# Patient Record
Sex: Male | Born: 1985 | Race: White | Hispanic: No | Marital: Single | Smoking: Current every day smoker
Health system: Southern US, Community
[De-identification: ages and names within clinical notes are randomized; demographics above are authoritative.]

## PROBLEM LIST (undated history)

## (undated) DIAGNOSIS — N289 Disorder of kidney and ureter, unspecified: Secondary | ICD-10-CM

## (undated) DIAGNOSIS — N200 Calculus of kidney: Secondary | ICD-10-CM

## (undated) HISTORY — PX: CHOLECYSTECTOMY: SHX55

---

## 2015-11-15 ENCOUNTER — Emergency Department (HOSPITAL_COMMUNITY): Payer: Medicare Other

## 2015-11-15 ENCOUNTER — Encounter (HOSPITAL_COMMUNITY): Payer: Self-pay | Admitting: *Deleted

## 2015-11-15 ENCOUNTER — Emergency Department (HOSPITAL_COMMUNITY)
Admission: EM | Admit: 2015-11-15 | Discharge: 2015-11-15 | Disposition: A | Discharge status: Home / Self Care | Payer: Medicare Other | Attending: Emergency Medicine | Admitting: Emergency Medicine

## 2015-11-15 DIAGNOSIS — F17200 Nicotine dependence, unspecified, uncomplicated: Secondary | ICD-10-CM | POA: Insufficient documentation

## 2015-11-15 DIAGNOSIS — S91332A Puncture wound without foreign body, left foot, initial encounter: Secondary | ICD-10-CM | POA: Diagnosis not present

## 2015-11-15 DIAGNOSIS — J069 Acute upper respiratory infection, unspecified: Secondary | ICD-10-CM | POA: Diagnosis not present

## 2015-11-15 DIAGNOSIS — Y998 Other external cause status: Secondary | ICD-10-CM | POA: Insufficient documentation

## 2015-11-15 DIAGNOSIS — Y92007 Garden or yard of unspecified non-institutional (private) residence as the place of occurrence of the external cause: Secondary | ICD-10-CM | POA: Insufficient documentation

## 2015-11-15 DIAGNOSIS — B9789 Other viral agents as the cause of diseases classified elsewhere: Secondary | ICD-10-CM

## 2015-11-15 DIAGNOSIS — W268XXA Contact with other sharp object(s), not elsewhere classified, initial encounter: Secondary | ICD-10-CM | POA: Insufficient documentation

## 2015-11-15 DIAGNOSIS — Y9301 Activity, walking, marching and hiking: Secondary | ICD-10-CM | POA: Diagnosis not present

## 2015-11-15 MED ORDER — CIPROFLOXACIN HCL 500 MG PO TABS
500.0000 mg | ORAL_TABLET | Freq: Once | ORAL | Status: AC
  Administered 2015-11-15: 500 mg via ORAL
  Filled 2015-11-15: qty 1

## 2015-11-15 MED ORDER — PREDNISONE 20 MG PO TABS: 40.0000 mg | ORAL_TABLET | Freq: Every day | ORAL | Status: DC

## 2015-11-15 MED ORDER — OXYCODONE-ACETAMINOPHEN 5-325 MG PO TABS
2.0000 | ORAL_TABLET | Freq: Once | ORAL | Status: AC
Start: 2015-11-15 — End: 2015-11-15
  Administered 2015-11-15: 2 via ORAL
  Filled 2015-11-15: qty 2

## 2015-11-15 MED ORDER — OXYCODONE-ACETAMINOPHEN 5-325 MG PO TABS: 1.0000 | ORAL_TABLET | ORAL | Status: DC | PRN

## 2015-11-15 MED ORDER — FLUTICASONE PROPIONATE 50 MCG/ACT NA SUSP: 2.0000 | Freq: Every day | NASAL | Status: DC

## 2015-11-15 MED ORDER — CIPROFLOXACIN HCL 500 MG PO TABS: 500.0000 mg | ORAL_TABLET | Freq: Two times a day (BID) | ORAL | Status: DC

## 2015-11-15 MED ORDER — ALBUTEROL SULFATE HFA 108 (90 BASE) MCG/ACT IN AERS
2.0000 | INHALATION_SPRAY | Freq: Once | RESPIRATORY_TRACT | Status: AC
  Administered 2015-11-15: 2 via RESPIRATORY_TRACT
  Filled 2015-11-15: qty 6.7

## 2015-11-15 MED ORDER — BENZONATATE 100 MG PO CAPS: 100.0000 mg | ORAL_CAPSULE | Freq: Three times a day (TID) | ORAL | Status: DC | PRN

## 2015-11-15 NOTE — ED Provider Notes (Signed)
History  By signing my name below, I, Karle Plumber, attest that this documentation has been prepared under the direction and in the presence of [employer], PA-C. Electronically Signed: Karle Plumber, ED Scribe. 11/15/2015. 11:09 PM.  Chief Complaint  Patient presents with  . Puncture Wound  . Nasal Congestion   The history is provided by the patient and medical records. No language interpreter was used.    HPI Comments:  Jordan Matthews is a 29 y.o. male who presents to the Emergency Department complaining of nasal and chest congestion that began 2-3 days ago. He reports associated productive cough of yellowish mucus. He also reports chills and diaphoresis. He has been taking DayQuil and NyQuil with no significant relief of the symptoms. Lying down increases the cough. He denies modifying factors. He denies fever, nausea or vomiting. Pt is a smoker. He also reports a puncture wound to the plantar aspect of the left foot that he sustained about 11 hours ago. He states he was walking around in his yard when he stepped on something metal that penetrated his shoe and entered the skin. He has removed the metal has taken Ibuprofen 400 mg for pain. Walking increases the pain. Avoiding bearing weight helps alleviate the pain. He denies numbness, tingling or weakness of the left foot.   History reviewed. No pertinent past medical history. History reviewed. No pertinent past surgical history. No family history on file. Social History  Substance Use Topics  . Smoking status: Current Every Day Smoker  . Smokeless tobacco: None  . Alcohol Use: Yes    Review of Systems  HENT: Positive for congestion.   Skin: Positive for wound.  All other systems reviewed and are negative.   Allergies  Review of patient's allergies indicates not on file.  Home Medications   Prior to Admission medications   Medication Sig Start Date End Date Taking? Authorizing Provider  benzonatate (TESSALON) 100 MG  capsule Take 1 capsule (100 mg total) by mouth 3 (three) times daily as needed for cough. 11/15/15   Antony Madura, PA-C  ciprofloxacin (CIPRO) 500 MG tablet Take 1 tablet (500 mg total) by mouth 2 (two) times daily. 11/15/15   Antony Madura, PA-C  fluticasone (FLONASE) 50 MCG/ACT nasal spray Place 2 sprays into both nostrils daily. 11/15/15   Antony Madura, PA-C  oxyCODONE-acetaminophen (PERCOCET/ROXICET) 5-325 MG tablet Take 1 tablet by mouth every 4 (four) hours as needed for severe pain. 11/15/15   Antony Madura, PA-C  predniSONE (DELTASONE) 20 MG tablet Take 2 tablets (40 mg total) by mouth daily. 11/15/15   Antony Madura, PA-C   Triage Vitals: BP 153/91 mmHg  Pulse 69  Temp(Src) 98.1 F (36.7 C) (Oral)  Resp 18  SpO2 98%  Physical Exam  Constitutional: He is oriented to person, place, and time. He appears well-developed and well-nourished. No distress.  Nontoxic/nonseptic appearing  HENT:  Head: Normocephalic and atraumatic.  Right Ear: External ear normal.  Left Ear: External ear normal.  Nose: Mucosal edema (mild) present. No rhinorrhea.  Mouth/Throat: Uvula is midline, oropharynx is clear and moist and mucous membranes are normal.  Eyes: Conjunctivae and EOM are normal. No scleral icterus.  Neck: Normal range of motion.  Cardiovascular: Normal rate, regular rhythm and intact distal pulses.   Pulmonary/Chest: Effort normal and breath sounds normal. No respiratory distress. He has no wheezes. He has no rales.  Lungs CTAB. Respirations even and unlabored.  Musculoskeletal: Normal range of motion.  Neurological: He is alert and oriented to  person, place, and time. He exhibits normal muscle tone. Coordination normal.  Patient ambulatory with steady gait. Sensation to light touch intact in LLE.  Skin: Skin is warm and dry. No rash noted. He is not diaphoretic. No erythema. No pallor.  Puncture wound to ball of L foot. No erythema, swelling, or heat to touch. Mild TTP. No FB or deformity.   Psychiatric: He has a normal mood and affect. His behavior is normal.  Nursing note and vitals reviewed.   ED Course  Procedures (including critical care time) DIAGNOSTIC STUDIES: Oxygen Saturation is 98% on RA, normal by my interpretation.   COORDINATION OF CARE: 11:08 PM- Will prescribe antibiotic, pain medication for left foot. Will prescribe nasal spray, MDI, cough medication and steroids for congestion. Pt verbalizes understanding and agrees to plan.  Medications  ciprofloxacin (CIPRO) tablet 500 mg (not administered)  oxyCODONE-acetaminophen (PERCOCET/ROXICET) 5-325 MG per tablet 2 tablet (not administered)  albuterol (PROVENTIL HFA;VENTOLIN HFA) 108 (90 BASE) MCG/ACT inhaler 2 puff (not administered)    Labs Review Labs Reviewed - No data to display  Imaging Review Dg Foot Complete Left  11/15/2015  CLINICAL DATA:  Left foot pain on the plantar surface at the metatarsal area of the great toe. Patient stepped on nail today. Hurts to walk on the left foot. EXAM: LEFT FOOT - COMPLETE 3+ VIEW COMPARISON:  None. FINDINGS: There is no evidence of fracture or dislocation. There is no evidence of arthropathy or other focal bone abnormality. Soft tissues are unremarkable. No radiopaque soft tissue foreign bodies or gas collections identified. IMPRESSION: Negative. Electronically Signed   By: Burman NievesWilliam  Stevens M.D.   On: 11/15/2015 23:12   I have personally reviewed and evaluated these images and lab results as part of my medical decision-making.   EKG Interpretation None      MDM   Final diagnoses:  Puncture wound of foot, left, initial encounter  Viral URI with cough    Patient complaining of symptoms c/w URI; mild to moderate symptoms of clear/yellow nasal discharge/congestion and scratchy throat with cough for less than 10 days. Positive sick contacts in the home with similar symptoms. Patient is afebrile. No concern for acute bacterial rhinosinusitis; likely viral in  nature. Patient discharged with symptomatic treatment. Patient instructions given for warm saline nasal washes. Recommendations given for follow-up with primary care physician.    Patient also complaining of a puncture wound which happened this morning. X-ray negative and patient is neurovascularly intact. No evidence of secondary infection or cellulitis. Will start on ciprofloxacin for antibiotic coverage given that puncture occurred through shoe. Short course of percocet given for pain. Patient discharged in good condition with no unaddressed concerns.  I personally performed the services described in this documentation, which was scribed in my presence. The recorded information has been reviewed and is accurate.    Filed Vitals:   11/15/15 2208  BP: 153/91  Pulse: 69  Temp: 98.1 F (36.7 C)  TempSrc: Oral  Resp: 18  SpO2: 98%      Antony MaduraKelly Nuala Chiles, PA-C 11/15/15 2320  Alvira MondayErin Schlossman, MD 11/21/15 1338

## 2015-11-15 NOTE — ED Notes (Signed)
Pt states he stepped on a piece of metal that went through his shoe, causing a puncture wound to to the ball of his foot. Pt states he took 400mg  ibuprofen, which he states did not help with pain. Pt also complains of nasal congestion, sore throat and cough for the past 2-3 days.

## 2015-11-15 NOTE — Discharge Instructions (Signed)
For your puncture wound, take ciprofloxacin as prescribed to prevent infection. You may take Percocet for pain. Be sure to keep the wound clean and dry with warm soap and water.  For your upper respiratory infection take Tessalon as needed for cough and prednisone as prescribed for chest congestion. You may use an albuterol inhaler, 2 puffs every 4-6 hours, as needed for cough and shortness of breath. Take Tessalon as prescribed for cough as well. You may take ibuprofen for body aches. Follow-up with your primary care doctor for a recheck of symptoms.  Upper Respiratory Infection, Adult Most upper respiratory infections (URIs) are a viral infection of the air passages leading to the lungs. A URI affects the nose, throat, and upper air passages. The most common type of URI is nasopharyngitis and is typically referred to as "the common cold." URIs run their course and usually go away on their own. Most of the time, a URI does not require medical attention, but sometimes a bacterial infection in the upper airways can follow a viral infection. This is called a secondary infection. Sinus and middle ear infections are common types of secondary upper respiratory infections. Bacterial pneumonia can also complicate a URI. A URI can worsen asthma and chronic obstructive pulmonary disease (COPD). Sometimes, these complications can require emergency medical care and may be life threatening.  CAUSES Almost all URIs are caused by viruses. A virus is a type of germ and can spread from one person to another.  RISKS FACTORS You may be at risk for a URI if:   You smoke.   You have chronic heart or lung disease.  You have a weakened defense (immune) system.   You are very young or very old.   You have nasal allergies or asthma.  You work in crowded or poorly ventilated areas.  You work in health care facilities or schools. SIGNS AND SYMPTOMS  Symptoms typically develop 2-3 days after you come in contact  with a cold virus. Most viral URIs last 7-10 days. However, viral URIs from the influenza virus (flu virus) can last 14-18 days and are typically more severe. Symptoms may include:   Runny or stuffy (congested) nose.   Sneezing.   Cough.   Sore throat.   Headache.   Fatigue.   Fever.   Loss of appetite.   Pain in your forehead, behind your eyes, and over your cheekbones (sinus pain).  Muscle aches.  DIAGNOSIS  Your health care provider may diagnose a URI by:  Physical exam.  Tests to check that your symptoms are not due to another condition such as:  Strep throat.  Sinusitis.  Pneumonia.  Asthma. TREATMENT  A URI goes away on its own with time. It cannot be cured with medicines, but medicines may be prescribed or recommended to relieve symptoms. Medicines may help:  Reduce your fever.  Reduce your cough.  Relieve nasal congestion. HOME CARE INSTRUCTIONS   Take medicines only as directed by your health care provider.   Gargle warm saltwater or take cough drops to comfort your throat as directed by your health care provider.  Use a warm mist humidifier or inhale steam from a shower to increase air moisture. This may make it easier to breathe.  Drink enough fluid to keep your urine clear or pale yellow.   Eat soups and other clear broths and maintain good nutrition.   Rest as needed.   Return to work when your temperature has returned to normal or as your  health care provider advises. You may need to stay home longer to avoid infecting others. You can also use a face mask and careful hand washing to prevent spread of the virus.  Increase the usage of your inhaler if you have asthma.   Do not use any tobacco products, including cigarettes, chewing tobacco, or electronic cigarettes. If you need help quitting, ask your health care provider. PREVENTION  The best way to protect yourself from getting a cold is to practice good hygiene.   Avoid  oral or hand contact with people with cold symptoms.   Wash your hands often if contact occurs.  There is no clear evidence that vitamin C, vitamin E, echinacea, or exercise reduces the chance of developing a cold. However, it is always recommended to get plenty of rest, exercise, and practice good nutrition.  SEEK MEDICAL CARE IF:   You are getting worse rather than better.   Your symptoms are not controlled by medicine.   You have chills.  You have worsening shortness of breath.  You have brown or red mucus.  You have yellow or brown nasal discharge.  You have pain in your face, especially when you bend forward.  You have a fever.  You have swollen neck glands.  You have pain while swallowing.  You have white areas in the back of your throat. SEEK IMMEDIATE MEDICAL CARE IF:   You have severe or persistent:  Headache.  Ear pain.  Sinus pain.  Chest pain.  You have chronic lung disease and any of the following:  Wheezing.  Prolonged cough.  Coughing up blood.  A change in your usual mucus.  You have a stiff neck.  You have changes in your:  Vision.  Hearing.  Thinking.  Mood. MAKE SURE YOU:   Understand these instructions.  Will watch your condition.  Will get help right away if you are not doing well or get worse.   This information is not intended to replace advice given to you by your health care provider. Make sure you discuss any questions you have with your health care provider.   Document Released: 05/12/2001 Document Revised: 04/02/2015 Document Reviewed: 02/21/2014 Elsevier Interactive Patient Education 2016 [employer] Puncture Wound A puncture wound is an injury that is caused by a sharp, thin object that goes through (penetrates) your skin. Usually, a puncture wound does not leave a large opening in your skin, so it may not bleed a lot. However, when you get a puncture wound, dirt or other materials (foreign bodies) can be  forced into your wound and break off inside. This increases the chance of infection, such as tetanus. CAUSES Puncture wounds are caused by any sharp, thin object that goes through your skin, such as:  Animal teeth, as with an animal bite.  Sharp, pointed objects, such as nails, splinters of glass, fishhooks, and needles. SYMPTOMS Symptoms of a puncture wound include:  Pain.  Bleeding.  Swelling.  Bruising.  Fluid leaking from the wound.  Numbness, tingling, or loss of function. DIAGNOSIS This condition is diagnosed with a medical history and physical exam. Your wound will be checked to see if it contains any foreign bodies. You may also have X-rays or other imaging tests. TREATMENT Treatment for a puncture wound depends on how serious the wound is. It also depends on whether the wound contains any foreign bodies. Treatment for all types of puncture wounds usually starts with:  Controlling the bleeding.  Washing out the wound with a  germ-free (sterile) salt-water solution.  Checking the wound for foreign bodies. Treatment may also include:  Having the wound opened surgically to remove a foreign object.  Closing the wound with stitches (sutures) if it continues to bleed.  Covering the wound with antibiotic ointments and a bandage (dressing).  Receiving a tetanus shot.  Receiving a rabies vaccine. HOME CARE INSTRUCTIONS Medicines  Take or apply over-the-counter and prescription medicines only as told by your health care provider.  If you were prescribed an antibiotic, take or apply it as told by your health care provider. Do not stop using the antibiotic even if your condition improves. Wound Care  There are many ways to close and cover a wound. For example, a wound can be covered with sutures, skin glue, or adhesive strips. Follow instructions from your health care provider about:  How to take care of your wound.  When and how you should change your  dressing.  When you should remove your dressing.  Removing whatever was used to close your wound.  Keep the dressing dry as told by your health care provider. Do not take baths, swim, use a hot tub, or do anything that would put your wound underwater until your health care provider approves.  Clean the wound as told by your health care provider.  Do not scratch or pick at the wound.  Check your wound every day for signs of infection. Watch for:  Redness, swelling, or pain.  Fluid, blood, or pus. General Instructions  Raise (elevate) the injured area above the level of your heart while you are sitting or lying down.  If your puncture wound is in your foot, ask your health care provider if you need to avoid putting weight on your foot and for how long.  Keep all follow-up visits as told by your health care provider. This is important. SEEK MEDICAL CARE IF:  You received a tetanus shot and you have swelling, severe pain, redness, or bleeding at the injection site.  You have a fever.  Your sutures come out.  You notice a bad smell coming from your wound or your dressing.  You notice something coming out of your wound, such as wood or glass.  Your pain is not controlled with medicine.  You have increased redness, swelling, or pain at the site of your wound.  You have fluid, blood, or pus coming from your wound.  You notice a change in the color of your skin near your wound.  You need to change the dressing frequently due to fluid, blood, or pus draining from your wound.  You develop a new rash.  You develop numbness around your wound. SEEK IMMEDIATE MEDICAL CARE IF:  You develop severe swelling around your wound.  Your pain suddenly increases and is severe.  You develop painful skin lumps.  You have a red streak going away from your wound.  The wound is on your hand or foot and you cannot properly move a finger or toe.  The wound is on your hand or foot and you  notice that your fingers or toes look pale or bluish.   This information is not intended to replace advice given to you by your health care provider. Make sure you discuss any questions you have with your health care provider.   Document Released: 08/26/2005 Document Revised: 08/07/2015 Document Reviewed: 01/09/2015 Elsevier Interactive Patient Education [employer]

## 2015-12-05 ENCOUNTER — Emergency Department (HOSPITAL_COMMUNITY)
Admission: EM | Admit: 2015-12-05 | Discharge: 2015-12-05 | Disposition: A | Discharge status: Home / Self Care | Payer: Medicare Other | Attending: Emergency Medicine | Admitting: Emergency Medicine

## 2015-12-05 ENCOUNTER — Encounter (HOSPITAL_COMMUNITY): Payer: Self-pay | Admitting: Emergency Medicine

## 2015-12-05 ENCOUNTER — Emergency Department (HOSPITAL_COMMUNITY): Payer: Medicare Other

## 2015-12-05 DIAGNOSIS — R109 Unspecified abdominal pain: Secondary | ICD-10-CM | POA: Insufficient documentation

## 2015-12-05 DIAGNOSIS — R630 Anorexia: Secondary | ICD-10-CM | POA: Insufficient documentation

## 2015-12-05 DIAGNOSIS — Z7951 Long term (current) use of inhaled steroids: Secondary | ICD-10-CM | POA: Insufficient documentation

## 2015-12-05 DIAGNOSIS — R112 Nausea with vomiting, unspecified: Secondary | ICD-10-CM | POA: Diagnosis not present

## 2015-12-05 DIAGNOSIS — Z87442 Personal history of urinary calculi: Secondary | ICD-10-CM | POA: Diagnosis not present

## 2015-12-05 DIAGNOSIS — F17200 Nicotine dependence, unspecified, uncomplicated: Secondary | ICD-10-CM | POA: Insufficient documentation

## 2015-12-05 DIAGNOSIS — Z87448 Personal history of other diseases of urinary system: Secondary | ICD-10-CM | POA: Diagnosis not present

## 2015-12-05 HISTORY — DX: Disorder of kidney and ureter, unspecified: N28.9

## 2015-12-05 HISTORY — DX: Calculus of kidney: N20.0

## 2015-12-05 LAB — URINALYSIS, ROUTINE W REFLEX MICROSCOPIC
Bilirubin Urine: NEGATIVE
Glucose, UA: NEGATIVE mg/dL
HGB URINE DIPSTICK: NEGATIVE
Ketones, ur: NEGATIVE mg/dL
LEUKOCYTES UA: NEGATIVE
NITRITE: NEGATIVE
PROTEIN: NEGATIVE mg/dL
Specific Gravity, Urine: 1.014 (ref 1.005–1.030)
pH: 7 (ref 5.0–8.0)

## 2015-12-05 LAB — BASIC METABOLIC PANEL
Anion gap: 8 (ref 5–15)
BUN: 19 mg/dL (ref 6–20)
CALCIUM: 9.9 mg/dL (ref 8.9–10.3)
CO2: 29 mmol/L (ref 22–32)
Chloride: 104 mmol/L (ref 101–111)
Creatinine, Ser: 0.98 mg/dL (ref 0.61–1.24)
Glucose, Bld: 100 mg/dL — ABNORMAL HIGH (ref 65–99)
POTASSIUM: 4.5 mmol/L (ref 3.5–5.1)
SODIUM: 141 mmol/L (ref 135–145)

## 2015-12-05 LAB — CBC
HEMATOCRIT: 43.8 % (ref 39.0–52.0)
Hemoglobin: 14.8 g/dL (ref 13.0–17.0)
MCH: 32.2 pg (ref 26.0–34.0)
MCHC: 33.8 g/dL (ref 30.0–36.0)
MCV: 95.2 fL (ref 78.0–100.0)
Platelets: 185 10*3/uL (ref 150–400)
RBC: 4.6 MIL/uL (ref 4.22–5.81)
RDW: 12.2 % (ref 11.5–15.5)
WBC: 6.3 10*3/uL (ref 4.0–10.5)

## 2015-12-05 MED ORDER — SODIUM CHLORIDE 0.9 % IV BOLUS (SEPSIS)
1000.0000 mL | Freq: Once | INTRAVENOUS | Status: AC
  Administered 2015-12-05: 1000 mL via INTRAVENOUS

## 2015-12-05 MED ORDER — ONDANSETRON 8 MG PO TBDP: 8.0000 mg | ORAL_TABLET | Freq: Three times a day (TID) | ORAL | Status: DC | PRN

## 2015-12-05 MED ORDER — HYDROMORPHONE HCL 1 MG/ML IJ SOLN
1.0000 mg | Freq: Once | INTRAMUSCULAR | Status: AC
  Administered 2015-12-05: 1 mg via INTRAVENOUS
  Filled 2015-12-05: qty 1

## 2015-12-05 MED ORDER — KETOROLAC TROMETHAMINE 30 MG/ML IJ SOLN
30.0000 mg | Freq: Once | INTRAMUSCULAR | Status: AC
  Administered 2015-12-05: 30 mg via INTRAVENOUS
  Filled 2015-12-05: qty 1

## 2015-12-05 MED ORDER — ONDANSETRON HCL 4 MG/2ML IJ SOLN
4.0000 mg | Freq: Once | INTRAMUSCULAR | Status: AC
  Administered 2015-12-05: 4 mg via INTRAVENOUS
  Filled 2015-12-05: qty 2

## 2015-12-05 MED ORDER — IBUPROFEN 600 MG PO TABS: 600.0000 mg | ORAL_TABLET | Freq: Three times a day (TID) | ORAL | Status: AC | PRN

## 2015-12-05 NOTE — ED Provider Notes (Signed)
CSN: [id number]     Arrival date & time 12/05/15  1145 History   First MD Initiated Contact with Patient 12/05/15 1419     Chief Complaint  Patient presents with  . Flank Pain      HPI Patient presents to the emergency department complaining of left flank pain over the past 24-48 hours.  Reports nausea vomiting with poor appetite.  Denies fevers and chills.  No dysuria or urinary frequency.  He does have a history of kidney stones and states this feels similar to his prior discomfort and pain.  He's not allergic to medications.  He denies chest pain shortness breath.  His pain is moderate in severity this time   Past Medical History  Diagnosis Date  . Renal disorder   . Kidney stone    History reviewed. No pertinent past surgical history. No family history on file. Social History  Substance Use Topics  . Smoking status: Current Every Day Smoker  . Smokeless tobacco: None  . Alcohol Use: Yes    Review of Systems  All other systems reviewed and are negative.     Allergies  Review of patient's allergies indicates no known allergies.  Home Medications   Prior to Admission medications   Medication Sig Start Date End Date Taking? Authorizing Provider  ibuprofen (ADVIL,MOTRIN) 200 MG tablet Take 400-800 mg by mouth every 6 (six) hours as needed for moderate pain.   Yes Historical Provider, MD  benzonatate (TESSALON) 100 MG capsule Take 1 capsule (100 mg total) by mouth 3 (three) times daily as needed for cough. Patient not taking: Reported on 12/05/2015 11/15/15   Antony MaduraKelly Humes, PA-C  ciprofloxacin (CIPRO) 500 MG tablet Take 1 tablet (500 mg total) by mouth 2 (two) times daily. Patient not taking: Reported on 12/05/2015 11/15/15   Antony MaduraKelly Humes, PA-C  fluticasone [employer](FLONASE) 50 MCG/ACT nasal spray Place 2 sprays into both nostrils daily. Patient not taking: Reported on 12/05/2015 11/15/15   Antony MaduraKelly Humes, PA-C  oxyCODONE-acetaminophen (PERCOCET/ROXICET) 5-325 MG tablet Take 1 tablet by mouth  every 4 (four) hours as needed for severe pain. Patient not taking: Reported on 12/05/2015 11/15/15   Antony MaduraKelly Humes, PA-C  predniSONE (DELTASONE) 20 MG tablet Take 2 tablets (40 mg total) by mouth daily. Patient not taking: Reported on 12/05/2015 11/15/15   Antony MaduraKelly Humes, PA-C   BP 141/78 mmHg  Pulse 62  Temp(Src) 98.1 F (36.7 C) (Oral)  Resp 18  SpO2 100% Physical Exam  Constitutional: He is oriented to person, place, and time. He appears well-developed and well-nourished.  HENT:  Head: Normocephalic and atraumatic.  Eyes: EOM are normal.  Neck: Normal range of motion.  Cardiovascular: Normal rate, regular rhythm, normal heart sounds and intact distal pulses.   Pulmonary/Chest: Effort normal and breath sounds normal. No respiratory distress.  Abdominal: Soft. He exhibits no distension. There is no tenderness.  Musculoskeletal: Normal range of motion.  Neurological: He is alert and oriented to person, place, and time.  Skin: Skin is warm and dry.  Psychiatric: He has a normal mood and affect. Judgment normal.  Nursing note and vitals reviewed.   ED Course  Procedures (including critical care time) Labs Review Labs Reviewed  BASIC METABOLIC PANEL - Abnormal; Notable for the following:    Glucose, Bld 100 (*)    All other components within normal limits  URINALYSIS, ROUTINE W REFLEX MICROSCOPIC [facility])  CBC    Imaging Review Ct Renal Stone Study  12/05/2015  CLINICAL DATA:  Left flank pain.  Nausea and vomiting. EXAM: CT ABDOMEN AND PELVIS WITHOUT CONTRAST TECHNIQUE: Multidetector CT imaging of the abdomen and pelvis was performed following the standard protocol without IV contrast. COMPARISON:  None. FINDINGS: Lower chest:  Unremarkable Hepatobiliary: Unremarkable Pancreas: Unremarkable Spleen: Unremarkable Adrenals/Urinary Tract: Unremarkable Stomach/Bowel: Slightly mobile cecum, in the left mid abdomen. Appendix normal. Scattered air-fluid levels in nondilated loops of small  bowel, without appreciable small bowel wall thickening. Vascular/Lymphatic: Scattered small mesenteric lymph nodes are somewhat increased in number but not pathologically enlarged. Reproductive: Unremarkable Other: No supplemental non-categorized findings. Musculoskeletal: Schmorl's nodes along the superior endplates of L4 and L5. IMPRESSION: 1. There scattered air-fluid levels in nondilated loops of small bowel, with some scattered mesenteric lymph nodes. Appearance suspicious for enteritis. No dilated bowel to suggest obstruction. The appendix appears normal. Electronically Signed   By: Gaylyn Rong M.D.   On: 12/05/2015 15:25   I have personally reviewed and evaluated these images and lab results as part of my medical decision-making.   EKG Interpretation None      MDM   Final diagnoses:  Left flank pain    3:43 PM Urine without signs of infection.  CT scan is without abnormalities.  No obstructing left-sided ureteral stones.  Remainder of CT scans without acute abnormalities.  Doubt small bowel obstruction.  Primary care follow-up.  Home with Zofran and anti-inflammatories.    Azalia Bilis, MD 12/05/15 [phone]

## 2015-12-05 NOTE — ED Notes (Signed)
Per pt, states left flank pain with N/V-poor appetite-states history of stones, feels like same symptoms in past

## 2015-12-16 ENCOUNTER — Emergency Department (HOSPITAL_BASED_OUTPATIENT_CLINIC_OR_DEPARTMENT_OTHER): Payer: Medicare Other

## 2015-12-16 ENCOUNTER — Emergency Department (HOSPITAL_BASED_OUTPATIENT_CLINIC_OR_DEPARTMENT_OTHER)
Admission: EM | Admit: 2015-12-16 | Discharge: 2015-12-16 | Disposition: A | Discharge status: Home / Self Care | Payer: Medicare Other | Attending: Emergency Medicine | Admitting: Emergency Medicine

## 2015-12-16 ENCOUNTER — Encounter (HOSPITAL_BASED_OUTPATIENT_CLINIC_OR_DEPARTMENT_OTHER): Payer: Self-pay | Admitting: *Deleted

## 2015-12-16 DIAGNOSIS — Z87442 Personal history of urinary calculi: Secondary | ICD-10-CM | POA: Diagnosis not present

## 2015-12-16 DIAGNOSIS — R1032 Left lower quadrant pain: Secondary | ICD-10-CM | POA: Diagnosis not present

## 2015-12-16 DIAGNOSIS — R1012 Left upper quadrant pain: Secondary | ICD-10-CM | POA: Diagnosis not present

## 2015-12-16 DIAGNOSIS — R112 Nausea with vomiting, unspecified: Secondary | ICD-10-CM | POA: Diagnosis not present

## 2015-12-16 DIAGNOSIS — R630 Anorexia: Secondary | ICD-10-CM | POA: Insufficient documentation

## 2015-12-16 DIAGNOSIS — Z87448 Personal history of other diseases of urinary system: Secondary | ICD-10-CM | POA: Diagnosis not present

## 2015-12-16 DIAGNOSIS — F17200 Nicotine dependence, unspecified, uncomplicated: Secondary | ICD-10-CM | POA: Insufficient documentation

## 2015-12-16 DIAGNOSIS — R109 Unspecified abdominal pain: Secondary | ICD-10-CM | POA: Diagnosis present

## 2015-12-16 DIAGNOSIS — R634 Abnormal weight loss: Secondary | ICD-10-CM | POA: Diagnosis not present

## 2015-12-16 LAB — COMPREHENSIVE METABOLIC PANEL
ALBUMIN: 4.8 g/dL (ref 3.5–5.0)
ALT: 20 U/L (ref 17–63)
ANION GAP: 8 (ref 5–15)
AST: 21 U/L (ref 15–41)
Alkaline Phosphatase: 59 U/L (ref 38–126)
BUN: 19 mg/dL (ref 6–20)
CHLORIDE: 104 mmol/L (ref 101–111)
CO2: 27 mmol/L (ref 22–32)
Calcium: 9.6 mg/dL (ref 8.9–10.3)
Creatinine, Ser: 1.06 mg/dL (ref 0.61–1.24)
GFR calc non Af Amer: >60 mL/min (ref >60)
GLUCOSE: 78 mg/dL (ref 65–99)
POTASSIUM: 3.8 mmol/L (ref 3.5–5.1)
SODIUM: 139 mmol/L (ref 135–145)
Total Bilirubin: 0.8 mg/dL (ref 0.3–1.2)
Total Protein: 7.4 g/dL (ref 6.5–8.1)

## 2015-12-16 LAB — URINALYSIS, ROUTINE W REFLEX MICROSCOPIC
Bilirubin Urine: NEGATIVE
GLUCOSE, UA: NEGATIVE mg/dL
Hgb urine dipstick: NEGATIVE
Ketones, ur: 40 mg/dL — AB
NITRITE: NEGATIVE
PH: 5.5 (ref 5.0–8.0)
Protein, ur: NEGATIVE mg/dL
SPECIFIC GRAVITY, URINE: 1.022 (ref 1.005–1.030)

## 2015-12-16 LAB — CBC WITH DIFFERENTIAL/PLATELET
BASOS PCT: 0 %
Basophils Absolute: 0 10*3/uL (ref 0.0–0.1)
Eosinophils Absolute: 0 10*3/uL (ref 0.0–0.7)
Eosinophils Relative: 0 %
HEMATOCRIT: 44.9 % (ref 39.0–52.0)
HEMOGLOBIN: 15.5 g/dL (ref 13.0–17.0)
LYMPHS ABS: 1.7 10*3/uL (ref 0.7–4.0)
LYMPHS PCT: 18 %
MCH: 31.6 pg (ref 26.0–34.0)
MCHC: 34.5 g/dL (ref 30.0–36.0)
MCV: 91.6 fL (ref 78.0–100.0)
MONOS PCT: 6 %
Monocytes Absolute: 0.5 10*3/uL (ref 0.1–1.0)
NEUTROS ABS: 7 10*3/uL (ref 1.7–7.7)
NEUTROS PCT: 76 %
Platelets: 195 10*3/uL (ref 150–400)
RBC: 4.9 MIL/uL (ref 4.22–5.81)
RDW: 11.8 % (ref 11.5–15.5)
WBC: 9.2 10*3/uL (ref 4.0–10.5)

## 2015-12-16 LAB — URINE MICROSCOPIC-ADD ON

## 2015-12-16 LAB — LIPASE, BLOOD: Lipase: 25 U/L (ref 11–51)

## 2015-12-16 MED ORDER — ONDANSETRON 4 MG PO TBDP: ORAL_TABLET | ORAL | Status: DC

## 2015-12-16 MED ORDER — SODIUM CHLORIDE 0.9 % IV BOLUS (SEPSIS)
1000.0000 mL | Freq: Once | INTRAVENOUS | Status: AC
  Administered 2015-12-16: 1000 mL via INTRAVENOUS

## 2015-12-16 MED ORDER — ONDANSETRON HCL 4 MG/2ML IJ SOLN
4.0000 mg | Freq: Once | INTRAMUSCULAR | Status: AC
  Administered 2015-12-16: 4 mg via INTRAVENOUS
  Filled 2015-12-16: qty 2

## 2015-12-16 NOTE — Discharge Instructions (Signed)

## 2015-12-16 NOTE — ED Provider Notes (Signed)
CSN: [id number]     Arrival date & time 12/16/15  1415 History  By signing my name below, I, Jordan Matthews, attest that this documentation has been prepared under the direction and in the presence of Rolan BuccoMelanie Bernell Sigal, MD. Electronically Signed: Soijett Matthews, ED Scribe. 12/16/2015. 4:42 PM.   Chief Complaint  Patient presents with  . Flank Pain      Patient is a 30 y.o. male presenting with flank pain. The history is provided by the patient. No language interpreter was used.  Flank Pain Associated symptoms include abdominal pain. Pertinent negatives include no chest pain, no headaches and no shortness of breath. The symptoms are aggravated by drinking and eating. Nothing relieves the symptoms. Treatments tried: zofran and anti-inflammtory. The treatment provided no relief.    HPI Comments: Jordan Matthews is a 30 y.o. male with a medical hx of  kidney stone who presents to the Emergency Department complaining of constant, sharp left flank pain onset 2 weeks. He notes that he was seen in the ED on 12/05/15 for flank pain, n/v, and poor appetite and had a negative CT renal stone study and labs completed. He was Rx zofran and anti-inflammatory medication which has not alleviated his symptoms. Denies any abdominal pain in the past. Pt was also informed to follow up with his PCP for further evaluation. He was unable to follow up with his PCP due to some other issues and he called a GI [facility] and acquired an appointment on 12/24/15. He states that he is having associated symptoms of weight loss, moderate vomiting, and appetite change. He reports that his last stool was on 12/07/15 and was black.  He states that he has tried 800 mg Ibuprofen for the relief for his symptoms. He denies blood in stool but hasn't really had any significant stools over the last 2 weeks, difficulty urinating, and any other symptoms.   Past Medical History  Diagnosis Date  . Renal disorder   . Kidney stone     History reviewed. No pertinent past surgical history. No family history on file. Social History  Substance Use Topics  . Smoking status: Current Every Day Smoker  . Smokeless tobacco: None  . Alcohol Use: Yes    Review of Systems  Constitutional: Negative for fever, chills, diaphoresis and fatigue.  HENT: Negative for congestion, rhinorrhea and sneezing.   Eyes: Negative.   Respiratory: Negative for cough, chest tightness and shortness of breath.   Cardiovascular: Negative for chest pain and leg swelling.  Gastrointestinal: Positive for nausea, vomiting and abdominal pain. Negative for diarrhea and blood in stool.  Genitourinary: Positive for flank pain. Negative for frequency, hematuria and difficulty urinating.  Musculoskeletal: Negative for back pain and arthralgias.  Skin: Negative for rash.  Neurological: Negative for dizziness, speech difficulty, weakness, numbness and headaches.      Allergies  Review of patient's allergies indicates no known allergies.  Home Medications   Prior to Admission medications   Medication Sig Start Date End Date Taking? Authorizing Provider  ibuprofen (ADVIL,MOTRIN) 600 MG tablet Take 1 tablet (600 mg total) by mouth every 8 (eight) hours as needed. 12/05/15   Azalia BilisKevin Campos, MD  ondansetron (ZOFRAN ODT) 4 MG disintegrating tablet 4mg  ODT q4 hours prn nausea/vomit 12/16/15   Rolan BuccoMelanie Sefora Tietje, MD   BP 109/66 mmHg  Pulse 62  Temp(Src) 98 F (36.7 C) (Oral)  Resp 18  Ht 6\' 1"  (1.854 m)  Wt 163 lb (73.936 kg)  BMI 21.51 kg/m2  SpO2 100% Physical Exam  Constitutional: He is oriented to person, place, and time. He appears well-developed and well-nourished. No distress.  HENT:  Head: Normocephalic and atraumatic.  Mouth/Throat: Mucous membranes are dry.  Slightly dry mucous membranes  Eyes: Pupils are equal, round, and reactive to light.  Neck: Normal range of motion. Neck supple.  Cardiovascular: Normal rate, regular rhythm and normal heart  sounds.  Exam reveals no gallop and no friction rub.   No murmur heard. Pulmonary/Chest: Effort normal and breath sounds normal. No respiratory distress. He has no wheezes. He has no rales. He exhibits no tenderness.  Abdominal: Soft. Bowel sounds are normal. There is tenderness in the suprapubic area, left upper quadrant and left lower quadrant. There is no rebound and no guarding.  Musculoskeletal: Normal range of motion. He exhibits no edema.  Lymphadenopathy:    He has no cervical adenopathy.  Neurological: He is alert and oriented to person, place, and time.  Skin: Skin is warm and dry. No rash noted. He is not diaphoretic.  Psychiatric: He has a normal mood and affect.  Nursing note and vitals reviewed.   ED Course  Procedures (including critical care time) DIAGNOSTIC STUDIES: Oxygen Saturation is 100% on RA, nl by my interpretation.    COORDINATION OF CARE: 4:39 PM Discussed treatment plan with pt at bedside which includes UA, labs, abdominal xray and pt agreed to plan.    Labs Review Results for orders placed or performed during the hospital encounter of 12/16/15  Urinalysis, Routine w reflex microscopic (not at [facility])  Result Value Ref Range   Color, Urine AMBER (A) YELLOW   APPearance CLEAR CLEAR   Specific Gravity, Urine 1.022 1.005 - 1.030   pH 5.5 5.0 - 8.0   Glucose, UA NEGATIVE NEGATIVE mg/dL   Hgb urine dipstick NEGATIVE NEGATIVE   Bilirubin Urine NEGATIVE NEGATIVE   Ketones, ur 40 (A) NEGATIVE mg/dL   Protein, ur NEGATIVE NEGATIVE mg/dL   Nitrite NEGATIVE NEGATIVE   Leukocytes, UA SMALL (A) NEGATIVE  Urine microscopic-add on  Result Value Ref Range   Squamous Epithelial / LPF 0-5 (A) NONE SEEN   WBC, UA 0-5 0 - 5 WBC/hpf   RBC / HPF 0-5 0 - 5 RBC/hpf   Bacteria, UA RARE (A) NONE SEEN   Urine-Other MUCOUS PRESENT   Comprehensive metabolic panel  Result Value Ref Range   Sodium 139 135 - 145 mmol/L   Potassium 3.8 3.5 - 5.1 mmol/L   Chloride 104 101 -  111 mmol/L   CO2 27 22 - 32 mmol/L   Glucose, Bld 78 65 - 99 mg/dL   BUN 19 6 - 20 mg/dL   Creatinine, Ser 2.72 0.61 - 1.24 mg/dL   Calcium 9.6 8.9 - 53.6 mg/dL   Total Protein 7.4 6.5 - 8.1 g/dL   Albumin 4.8 3.5 - 5.0 g/dL   AST 21 15 - 41 U/L   ALT 20 17 - 63 U/L   Alkaline Phosphatase 59 38 - 126 U/L   Total Bilirubin 0.8 0.3 - 1.2 mg/dL   GFR calc non Af Amer >60 >60 mL/min   GFR calc Af Amer >60 >60 mL/min   Anion gap 8 5 - 15  CBC with Differential  Result Value Ref Range   WBC 9.2 4.0 - 10.5 K/uL   RBC 4.90 4.22 - 5.81 MIL/uL   Hemoglobin 15.5 13.0 - 17.0 g/dL   HCT 64.4 03.4 - 74.2 %   MCV 91.6 78.0 -  100.0 fL   MCH 31.6 26.0 - 34.0 pg   MCHC 34.5 30.0 - 36.0 g/dL   RDW 40.9 81.1 - 91.4 %   Platelets 195 150 - 400 K/uL   Neutrophils Relative % 76 %   Neutro Abs 7.0 1.7 - 7.7 K/uL   Lymphocytes Relative 18 %   Lymphs Abs 1.7 0.7 - 4.0 K/uL   Monocytes Relative 6 %   Monocytes Absolute 0.5 0.1 - 1.0 K/uL   Eosinophils Relative 0 %   Eosinophils Absolute 0.0 0.0 - 0.7 K/uL   Basophils Relative 0 %   Basophils Absolute 0.0 0.0 - 0.1 K/uL  Lipase, blood  Result Value Ref Range   Lipase 25 11 - 51 U/L   Dg Abd Acute W/chest  12/16/2015  CLINICAL DATA:  Left flank and left abdominal pain, vomiting since 12/02/2015. History of kidney stones. EXAM: DG ABDOMEN ACUTE W/ 1V CHEST COMPARISON:  CT 12/05/2015 FINDINGS: There is no evidence of dilated bowel loops or free intraperitoneal air. No radiopaque calculi or other significant radiographic abnormality is seen. Heart size and mediastinal contours are within normal limits. Both lungs are clear. IMPRESSION: Negative abdominal radiographs.  No acute cardiopulmonary disease. Electronically Signed   By: Charlett Nose M.D.   On: 12/16/2015 17:01   Ct Renal Stone Study  12/05/2015  CLINICAL DATA:  Left flank pain.  Nausea and vomiting. EXAM: CT ABDOMEN AND PELVIS WITHOUT CONTRAST TECHNIQUE: Multidetector CT imaging of the abdomen  and pelvis was performed following the standard protocol without IV contrast. COMPARISON:  None. FINDINGS: Lower chest:  Unremarkable Hepatobiliary: Unremarkable Pancreas: Unremarkable Spleen: Unremarkable Adrenals/Urinary Tract: Unremarkable Stomach/Bowel: Slightly mobile cecum, in the left mid abdomen. Appendix normal. Scattered air-fluid levels in nondilated loops of small bowel, without appreciable small bowel wall thickening. Vascular/Lymphatic: Scattered small mesenteric lymph nodes are somewhat increased in number but not pathologically enlarged. Reproductive: Unremarkable Other: No supplemental non-categorized findings. Musculoskeletal: Schmorl's nodes along the superior endplates of L4 and L5. IMPRESSION: 1. There scattered air-fluid levels in nondilated loops of small bowel, with some scattered mesenteric lymph nodes. Appearance suspicious for enteritis. No dilated bowel to suggest obstruction. The appendix appears normal. Electronically Signed   By: Gaylyn Rong M.D.   On: 12/05/2015 15:25      Imaging Review Dg Abd Acute W/chest  12/16/2015  CLINICAL DATA:  Left flank and left abdominal pain, vomiting since 12/02/2015. History of kidney stones. EXAM: DG ABDOMEN ACUTE W/ 1V CHEST COMPARISON:  CT 12/05/2015 FINDINGS: There is no evidence of dilated bowel loops or free intraperitoneal air. No radiopaque calculi or other significant radiographic abnormality is seen. Heart size and mediastinal contours are within normal limits. Both lungs are clear. IMPRESSION: Negative abdominal radiographs.  No acute cardiopulmonary disease. Electronically Signed   By: Charlett Nose M.D.   On: 12/16/2015 17:01   I have personally reviewed and evaluated these images and lab results as part of my medical decision-making.   EKG Interpretation None      MDM   Final diagnoses:  Left lower quadrant pain    Patient presents with a two-week history of abdominal pain associated vomiting. He has no  diarrhea. He had a recent CT scan which was unremarkable other than suggestions of enteritis. No kidney stones were noted on the CT scan. I do not feel that a second CT is warranted. He has no pain over his appendix. His labs are normal with a normal white count. He's afebrile. He was hydrated  in the ED. He was discharged with a prescription for Zofran. He was advised to use a clear liquid diet for the next few days and then slowly progress after that. He has an appointment to follow-up with GI on January 24. I advised him to use Tylenol rather than ibuprofen for symptomatic relief. Return precautions were given.  I personally performed the services described in this documentation, which was scribed in my presence.  The recorded information has been reviewed and considered.    Rolan Bucco, MD 12/16/15 [phone]

## 2015-12-16 NOTE — ED Notes (Signed)
Pt placed on auto vitals Q30.  

## 2015-12-16 NOTE — ED Notes (Signed)
Back pain, abdominal pain, and vomiting x 2 weeks.

## 2016-01-28 ENCOUNTER — Emergency Department (HOSPITAL_COMMUNITY)
Admission: EM | Admit: 2016-01-28 | Discharge: 2016-01-28 | Disposition: A | Discharge status: Home / Self Care | Payer: Medicare Other | Attending: Emergency Medicine | Admitting: Emergency Medicine

## 2016-01-28 ENCOUNTER — Encounter (HOSPITAL_COMMUNITY): Payer: Self-pay | Admitting: Cardiology

## 2016-01-28 ENCOUNTER — Emergency Department (HOSPITAL_COMMUNITY): Payer: Medicare Other

## 2016-01-28 DIAGNOSIS — R079 Chest pain, unspecified: Secondary | ICD-10-CM

## 2016-01-28 DIAGNOSIS — Z87448 Personal history of other diseases of urinary system: Secondary | ICD-10-CM | POA: Insufficient documentation

## 2016-01-28 DIAGNOSIS — R0602 Shortness of breath: Secondary | ICD-10-CM | POA: Insufficient documentation

## 2016-01-28 DIAGNOSIS — Z79899 Other long term (current) drug therapy: Secondary | ICD-10-CM | POA: Diagnosis not present

## 2016-01-28 DIAGNOSIS — R05 Cough: Secondary | ICD-10-CM | POA: Diagnosis not present

## 2016-01-28 DIAGNOSIS — R110 Nausea: Secondary | ICD-10-CM | POA: Diagnosis not present

## 2016-01-28 DIAGNOSIS — Z87442 Personal history of urinary calculi: Secondary | ICD-10-CM | POA: Insufficient documentation

## 2016-01-28 DIAGNOSIS — F17200 Nicotine dependence, unspecified, uncomplicated: Secondary | ICD-10-CM | POA: Insufficient documentation

## 2016-01-28 DIAGNOSIS — F410 Panic disorder [episodic paroxysmal anxiety] without agoraphobia: Secondary | ICD-10-CM | POA: Insufficient documentation

## 2016-01-28 LAB — BASIC METABOLIC PANEL
Anion gap: 12 (ref 5–15)
BUN: 9 mg/dL (ref 6–20)
CHLORIDE: 103 mmol/L (ref 101–111)
CO2: 25 mmol/L (ref 22–32)
Calcium: 9.9 mg/dL (ref 8.9–10.3)
Creatinine, Ser: 0.96 mg/dL (ref 0.61–1.24)
GFR calc Af Amer: >60 mL/min (ref >60)
GFR calc non Af Amer: >60 mL/min (ref >60)
Glucose, Bld: 116 mg/dL — ABNORMAL HIGH (ref 65–99)
POTASSIUM: 4.5 mmol/L (ref 3.5–5.1)
SODIUM: 140 mmol/L (ref 135–145)

## 2016-01-28 LAB — I-STAT TROPONIN, ED
TROPONIN I, POC: 0 ng/mL (ref 0.00–0.08)
TROPONIN I, POC: 0 ng/mL (ref 0.00–0.08)

## 2016-01-28 LAB — CBC
HEMATOCRIT: 43.1 % (ref 39.0–52.0)
HEMOGLOBIN: 14.3 g/dL (ref 13.0–17.0)
MCH: 30.7 pg (ref 26.0–34.0)
MCHC: 33.2 g/dL (ref 30.0–36.0)
MCV: 92.5 fL (ref 78.0–100.0)
Platelets: 197 10*3/uL (ref 150–400)
RBC: 4.66 MIL/uL (ref 4.22–5.81)
RDW: 12.4 % (ref 11.5–15.5)
WBC: 17.4 10*3/uL — AB (ref 4.0–10.5)

## 2016-01-28 MED ORDER — IBUPROFEN 400 MG PO TABS
600.0000 mg | ORAL_TABLET | Freq: Once | ORAL | Status: AC
  Administered 2016-01-28: 600 mg via ORAL
  Filled 2016-01-28: qty 1

## 2016-01-28 NOTE — ED Provider Notes (Signed)
The patient is a 30 year old male, history of anxiety and tobacco use, denies any other risk factors, no pulp variables some risk factors, states that his symptoms started approximately 7-1/2 hours prior to my evaluation, he has a pain in his chest, it is not reproducible with palpation or movement or exertion or position or deep breathing. He has never had this before, his EKG is normal, he has a clear heart and lung sounds, no peripheral edema, he is comfortable, he has had 2 normal troponins, he is stable for discharge to follow-up in the outpatient setting. He has a very low heart score, I have expressed the indications for return and he has been able to verbalize his understanding of the verbal instructions that I gave him.  I saw and evaluated the patient, reviewed the resident's note and I agree with the findings and plan.   EKG Interpretation  Date/Time:  Tuesday January 28 2016 12:35:45 EST Ventricular Rate:  75 PR Interval:  120 QRS Duration: 110 QT Interval:  370 QTC Calculation: 413 R Axis:   74 Text Interpretation:  Normal sinus rhythm Normal ECG No old tracing to compare Confirmed by Ario Mcdiarmid  MD, Ezel Vallone ([id number]) on 01/28/2016 4:53:24 PM       Final diagnoses:  Chest pain, unspecified chest pain type      Eber Hong, MD 01/29/16 [phone]

## 2016-01-28 NOTE — Discharge Instructions (Signed)
Please take ibuprofen or Tylenol at home for pain.     Nonspecific Chest Pain    Chest pain can be caused by many different conditions. There is always a chance that your pain could be related to something serious, such as a heart attack or a blood clot in your lungs. Chest pain can also be caused by conditions that are not life-threatening. If you have chest pain, it is very important to follow up with your health care provider.  CAUSES  Chest pain can be caused by:  Heartburn.  Pneumonia or bronchitis.  Anxiety or stress.  Inflammation around your heart (pericarditis) or lung (pleuritis or pleurisy).  A blood clot in your lung.  A collapsed lung (pneumothorax). It can develop suddenly on its own (spontaneous pneumothorax) or from trauma to the chest.  Shingles infection (varicella-zoster virus).  Heart attack.  Damage to the bones, muscles, and cartilage that make up your chest wall. This can include:  Bruised bones due to injury.  Strained muscles or cartilage due to frequent or repeated coughing or overwork.  Fracture to one or more ribs.  Sore cartilage due to inflammation (costochondritis). RISK FACTORS  Risk factors for chest pain may include:  Activities that increase your risk for trauma or injury to your chest.  Respiratory infections or conditions that cause frequent coughing.  Medical conditions or overeating that can cause heartburn.  Heart disease or family history of heart disease.  Conditions or health behaviors that increase your risk of developing a blood clot.  Having had chicken pox (varicella zoster). SIGNS AND SYMPTOMS  Chest pain can feel like:  Burning or tingling on the surface of your chest or deep in your chest.  Crushing, pressure, aching, or squeezing pain.  Dull or sharp pain that is worse when you move, cough, or take a deep breath.  Pain that is also felt in your back, neck, shoulder, or arm, or pain that spreads to any of these areas. Your chest  pain may come and go, or it may stay constant.  DIAGNOSIS  Lab tests or other studies may be needed to find the cause of your pain. Your health care provider may have you take a test called an ambulatory ECG (electrocardiogram). An ECG records your heartbeat patterns at the time the test is performed. You may also have other tests, such as:  Transthoracic echocardiogram (TTE). During echocardiography, sound waves are used to create a picture of all of the heart structures and to look at how blood flows through your heart.  Transesophageal echocardiogram (TEE). This is a more advanced imaging test that obtains images from inside your body. It allows your health care provider to see your heart in finer detail.  Cardiac monitoring. This allows your health care provider to monitor your heart rate and rhythm in real time.  Holter monitor. This is a portable device that records your heartbeat and can help to diagnose abnormal heartbeats. It allows your health care provider to track your heart activity for several days, if needed.  Stress tests. These can be done through exercise or by taking medicine that makes your heart beat more quickly.  Blood tests.  Imaging tests. TREATMENT  Your treatment depends on what is causing your chest pain. Treatment may include:  Medicines. These may include:  Acid blockers for heartburn.  Anti-inflammatory medicine.  Pain medicine for inflammatory conditions.  Antibiotic medicine, if an infection is present.  Medicines to dissolve blood clots.  Medicines to treat coronary  artery disease. Supportive care for conditions that do not require medicines. This may include:  Resting.  Applying heat or cold packs to injured areas.  Limiting activities until pain decreases. HOME CARE INSTRUCTIONS  If you were prescribed an antibiotic medicine, finish it all even if you start to feel better.  Avoid any activities that bring on chest pain.  Do not use any tobacco products,  including cigarettes, chewing tobacco, or electronic cigarettes. If you need help quitting, ask your health care provider.  Do not drink alcohol.  Take medicines only as directed by your health care provider.  Keep all follow-up visits as directed by your health care provider. This is important. This includes any further testing if your chest pain does not go away.  If heartburn is the cause for your chest pain, you may be told to keep your head raised (elevated) while sleeping. This reduces the chance that acid will go from your stomach into your esophagus.  Make lifestyle changes as directed by your health care provider. These may include:  Getting regular exercise. Ask your health care provider to suggest some activities that are safe for you.  Eating a heart-healthy diet. A registered dietitian can help you to learn healthy eating options.  Maintaining a healthy weight.  Managing diabetes, if necessary.  Reducing stress. SEEK MEDICAL CARE IF:  Your chest pain does not go away after treatment.  You have a rash with blisters on your chest.  You have a fever. SEEK IMMEDIATE MEDICAL CARE IF:  Your chest pain is worse.  You have an increasing cough, or you cough up blood.  You have severe abdominal pain.  You have severe weakness.  You faint.  You have chills.  You have sudden, unexplained chest discomfort.  You have sudden, unexplained discomfort in your arms, back, neck, or jaw.  You have shortness of breath at any time.  You suddenly start to sweat, or your skin gets clammy.  You feel nauseous or you vomit.  You suddenly feel light-headed or dizzy.  Your heart begins to beat quickly, or it feels like it is skipping beats. These symptoms may represent a serious problem that is an emergency. Do not wait to see if the symptoms will go away. Get medical help right away. Call your local emergency services (911 in the [place] Do not drive yourself to the hospital.  This information is not  intended to replace advice given to you by your health care provider. Make sure you discuss any questions you have with your health care provider.  Document Released: 08/26/2005 Document Revised: 12/07/2014 Document Reviewed: 06/22/2014  [employer] Interactive Patient Education [employer]

## 2016-01-28 NOTE — ED Provider Notes (Signed)
CSN: [id number]     Arrival date & time 01/28/16  1230 History   None    Chief Complaint  Patient presents with  . Chest Pain   Patient is a 30 y.o. male presenting with chest pain. The history is provided by the patient. No language interpreter was used.  Chest Pain Pain location:  Substernal area Pain quality: pressure   Pain radiates to:  Does not radiate Pain radiates to the back: no   Pain severity:  Moderate Onset quality:  Sudden Duration:  4 hours Timing:  Constant Progression:  Unchanged Chronicity:  New Context: at rest   Context: no movement   Relieved by:  None tried Worsened by:  Nothing tried Associated symptoms: cough, nausea and shortness of breath   Associated symptoms: no abdominal pain, no back pain, no dizziness, no dysphagia, no fever, no headache, no near-syncope, no numbness, no palpitations, no syncope, not vomiting and no weakness   Risk factors: male sex and smoking   Risk factors: no coronary artery disease, no diabetes mellitus, no high cholesterol, no hypertension and no prior DVT/PE     Past Medical History  Diagnosis Date  . Renal disorder   . Kidney stone    History reviewed. No pertinent past surgical history. History reviewed. No pertinent family history. Social History  Substance Use Topics  . Smoking status: Current Every Day Smoker  . Smokeless tobacco: None  . Alcohol Use: Yes    Review of Systems  Constitutional: Negative for fever, chills, activity change and appetite change.  HENT: Negative for congestion, dental problem, ear pain, facial swelling, hearing loss, rhinorrhea, sneezing, sore throat, trouble swallowing and voice change.   Eyes: Negative for photophobia, pain, redness and visual disturbance.  Respiratory: Positive for cough and shortness of breath. Negative for apnea, chest tightness, wheezing and stridor.   Cardiovascular: Positive for chest pain. Negative for palpitations, leg swelling, syncope and near-syncope.   Gastrointestinal: Positive for nausea. Negative for vomiting, abdominal pain, diarrhea, constipation, blood in stool and abdominal distention.  Endocrine: Negative for polydipsia and polyuria.  Genitourinary: Negative for frequency, hematuria, flank pain, decreased urine volume and difficulty urinating.  Musculoskeletal: Negative for back pain, joint swelling, gait problem, neck pain and neck stiffness.  Skin: Negative for rash and wound.  Allergic/Immunologic: Negative for immunocompromised state.  Neurological: Negative for dizziness, syncope, facial asymmetry, speech difficulty, weakness, light-headedness, numbness and headaches.  Hematological: Negative for adenopathy.  Psychiatric/Behavioral: Negative for suicidal ideas, behavioral problems, confusion, sleep disturbance and agitation. The patient is not nervous/anxious.   All other systems reviewed and are negative.     Allergies  Review of patient's allergies indicates no known allergies.  Home Medications   Prior to Admission medications   Medication Sig Start Date End Date Taking? Authorizing Provider  clonazePAM (KLONOPIN) 0.5 MG tablet Take 0.5 mg by mouth 2 (two) times daily as needed. Anxiety 01/22/16  Yes Historical Provider, MD  ibuprofen (ADVIL,MOTRIN) 600 MG tablet Take 1 tablet (600 mg total) by mouth every 8 (eight) hours as needed. 12/05/15  Yes Azalia Bilis, MD  OLANZapine (ZYPREXA) 20 MG tablet Take 20 mg by mouth daily. 01/01/16  Yes Historical Provider, MD   BP 130/80 mmHg  Pulse 52  Temp(Src) 97.8 F (36.6 C) (Oral)  Resp 21  Wt 73.936 kg  SpO2 100% Physical Exam  Constitutional: He is oriented to person, place, and time. He appears well-developed and well-nourished. No distress.  HENT:  Head: Normocephalic and atraumatic.  Right Ear: External ear normal.  Left Ear: External ear normal.  Eyes: Pupils are equal, round, and reactive to light. Right eye exhibits no discharge. Left eye exhibits no discharge.   Neck: Normal range of motion. No JVD present. No tracheal deviation present.  Cardiovascular: Normal rate, regular rhythm and normal heart sounds.  Exam reveals no friction rub.   No murmur heard. Pulmonary/Chest: Effort normal and breath sounds normal. No stridor. No respiratory distress. He has no wheezes.  No friction rub noted on exam.  Abdominal: Soft. Bowel sounds are normal. He exhibits no distension. There is no rebound and no guarding.  Musculoskeletal: Normal range of motion. He exhibits no edema or tenderness.  Lymphadenopathy:    He has no cervical adenopathy.  Neurological: He is alert and oriented to person, place, and time. No cranial nerve deficit. Coordination normal.  Skin: Skin is warm and dry. No rash noted. No pallor.  Psychiatric: He has a normal mood and affect. His behavior is normal. Judgment and thought content normal.  Nursing note and vitals reviewed.   ED Course  Procedures (including critical care time) Labs Review Labs Reviewed  BASIC METABOLIC PANEL - Abnormal; Notable for the following:    Glucose, Bld 116 (*)    All other components within normal limits  CBC - Abnormal; Notable for the following:    WBC 17.4 (*)    All other components within normal limits  I-STAT TROPOININ, ED  I-STAT TROPOININ, ED    Imaging Review Dg Chest 2 View  01/28/2016  CLINICAL DATA:  Left side chest pain, shortness of breath for 3 hours EXAM: CHEST  2 VIEW COMPARISON:  12/16/2015 FINDINGS: The heart size and mediastinal contours are within normal limits. Both lungs are clear. The visualized skeletal structures are unremarkable. IMPRESSION: No active cardiopulmonary disease. Electronically Signed   By: Natasha Mead M.D.   On: 01/28/2016 15:13   I have personally reviewed and evaluated these images and lab results as part of my medical decision-making.   EKG Interpretation   Date/Time:  Tuesday January 28 2016 12:35:45 EST Ventricular Rate:  75 PR Interval:  120 QRS  Duration: 110 QT Interval:  370 QTC Calculation: 413 R Axis:   74 Text Interpretation:  Normal sinus rhythm Normal ECG No old tracing to  compare Confirmed by MILLER  MD, BRIAN ([id number]) on 01/28/2016 4:53:24 PM      MDM   Final diagnoses:  Chest pain, unspecified chest pain type    Patient with history of panic attacks presents for evaluation of 3 hours of left-sided crushing chest pain. Pain started at rest and has no aggravating or alleviating features. Patient also shortness of breath. Endorses mild cough and mild nausea.  Physical exam shows normal vital signs. Patient appears well in no acute distress. No tenderness to palpation. Normal heart sounds without friction rub.  EKG reviewed, normal sinus rhythm heart rate 75, PR interval 120, QRS 110, QTC 413. No ischemic changes. No T-wave inversion. No PR depression.  Differential diagnosis includes musculoskeletal versus costochondral chondritis versus ACS versus PE versus pneumothorax versus pericarditis.  Patient PERC negative so doubt PE.    Troponin 2 negative. No evidence of ACS on labs. With blood cell count elevated but no clear sign of infection on history or physical.  Patient given ibuprofen for his pain. Chest pain improved to around 6 out of 10. He was urged to follow-up with primary care physician return ED with any new or concerning symptoms  including worsening or changing chest pain, increased shortness of breath.  Patient ambulatory at baseline at time of discharge.Patient was given phone number for cardiology at time of discharge. He was urged follow-up with PCP or cardiology if his chest pain continues.  Discussed with my attending Dr. Hyacinth Meeker.  Dan Humphreys, MD 01/28/16 1658  Eber Hong, MD 01/29/16 [phone]

## 2016-01-28 NOTE — ED Notes (Signed)
Pt verbalized understanding of d/c instructions and follow-up care. No further questions/concerns, VSS, ambulatory w/ steady gait (refused wheelchair) 

## 2016-01-28 NOTE — ED Notes (Addendum)
Reports chest pain that started about 3 hours ago, also having SOb and anxiety. Reports pain is to the left side of his chest.

## 2016-01-28 NOTE — ED Notes (Signed)
Pt transported to xray 

## 2016-05-18 ENCOUNTER — Emergency Department (HOSPITAL_COMMUNITY)
Admission: EM | Admit: 2016-05-18 | Discharge: 2016-05-18 | Disposition: A | Discharge status: Home / Self Care | Payer: Medicare Other | Attending: Emergency Medicine | Admitting: Emergency Medicine

## 2016-05-18 ENCOUNTER — Encounter (HOSPITAL_COMMUNITY): Payer: Self-pay

## 2016-05-18 ENCOUNTER — Emergency Department (HOSPITAL_COMMUNITY): Payer: Medicare Other

## 2016-05-18 DIAGNOSIS — F17200 Nicotine dependence, unspecified, uncomplicated: Secondary | ICD-10-CM | POA: Insufficient documentation

## 2016-05-18 DIAGNOSIS — R1011 Right upper quadrant pain: Secondary | ICD-10-CM | POA: Diagnosis present

## 2016-05-18 DIAGNOSIS — R197 Diarrhea, unspecified: Secondary | ICD-10-CM

## 2016-05-18 DIAGNOSIS — R112 Nausea with vomiting, unspecified: Secondary | ICD-10-CM | POA: Insufficient documentation

## 2016-05-18 LAB — CBC
HCT: 44 % (ref 39.0–52.0)
Hemoglobin: 14.7 g/dL (ref 13.0–17.0)
MCH: 31.7 pg (ref 26.0–34.0)
MCHC: 33.4 g/dL (ref 30.0–36.0)
MCV: 94.8 fL (ref 78.0–100.0)
Platelets: 194 K/uL (ref 150–400)
RBC: 4.64 MIL/uL (ref 4.22–5.81)
RDW: 12 % (ref 11.5–15.5)
WBC: 7.7 K/uL (ref 4.0–10.5)

## 2016-05-18 LAB — COMPREHENSIVE METABOLIC PANEL
ALBUMIN: 4.2 g/dL (ref 3.5–5.0)
ALT: 27 U/L (ref 17–63)
AST: 30 U/L (ref 15–41)
Alkaline Phosphatase: 65 U/L (ref 38–126)
Anion gap: 6 (ref 5–15)
BILIRUBIN TOTAL: 0.5 mg/dL (ref 0.3–1.2)
BUN: 9 mg/dL (ref 6–20)
CHLORIDE: 106 mmol/L (ref 101–111)
CO2: 27 mmol/L (ref 22–32)
Calcium: 10 mg/dL (ref 8.9–10.3)
Creatinine, Ser: 0.9 mg/dL (ref 0.61–1.24)
GFR calc Af Amer: >60 mL/min (ref >60)
GFR calc non Af Amer: >60 mL/min (ref >60)
GLUCOSE: 101 mg/dL — AB (ref 65–99)
POTASSIUM: 4 mmol/L (ref 3.5–5.1)
Sodium: 139 mmol/L (ref 135–145)
Total Protein: 6.6 g/dL (ref 6.5–8.1)

## 2016-05-18 LAB — URINALYSIS, ROUTINE W REFLEX MICROSCOPIC
Bilirubin Urine: NEGATIVE
Glucose, UA: NEGATIVE mg/dL
Hgb urine dipstick: NEGATIVE
Ketones, ur: NEGATIVE mg/dL
Leukocytes, UA: NEGATIVE
Nitrite: NEGATIVE
Protein, ur: NEGATIVE mg/dL
Specific Gravity, Urine: 1.012 (ref 1.005–1.030)
pH: 6.5 (ref 5.0–8.0)

## 2016-05-18 LAB — LIPASE, BLOOD: Lipase: 62 U/L — ABNORMAL HIGH (ref 11–51)

## 2016-05-18 MED ORDER — TRAMADOL HCL 50 MG PO TABS: 50.0000 mg | ORAL_TABLET | Freq: Four times a day (QID) | ORAL | Status: DC | PRN

## 2016-05-18 MED ORDER — TRAMADOL HCL 50 MG PO TABS
50.0000 mg | ORAL_TABLET | Freq: Once | ORAL | Status: AC
  Administered 2016-05-18: 50 mg via ORAL
  Filled 2016-05-18: qty 1

## 2016-05-18 MED ORDER — HYDROCODONE-ACETAMINOPHEN 5-325 MG PO TABS
1.0000 | ORAL_TABLET | Freq: Once | ORAL | Status: DC
  Filled 2016-05-18: qty 1

## 2016-05-18 MED ORDER — ONDANSETRON HCL 8 MG PO TABS: 8.0000 mg | ORAL_TABLET | Freq: Three times a day (TID) | ORAL | Status: DC | PRN

## 2016-05-18 MED ORDER — ONDANSETRON 4 MG PO TBDP
8.0000 mg | ORAL_TABLET | Freq: Once | ORAL | Status: AC
  Administered 2016-05-18: 8 mg via ORAL
  Filled 2016-05-18: qty 2

## 2016-05-18 MED ORDER — HYDROCODONE-ACETAMINOPHEN 5-325 MG PO TABS: 1.0000 | ORAL_TABLET | ORAL | Status: DC | PRN

## 2016-05-18 NOTE — ED Notes (Signed)
Pt complaining of R upper abdominal pain. Pt denies any nausea and vomiting. Pt states ~ 4 episodes of diarrhea today.

## 2016-05-18 NOTE — ED Provider Notes (Addendum)
CSN: [id number]     Arrival date & time 05/18/16  1656 History   First MD Initiated Contact with Patient 05/18/16 2120     Chief Complaint  Patient presents with  . Abdominal Pain     (Consider location/radiation/quality/duration/timing/severity/associated sxs/prior Treatment) HPI   He complains of right upper abdominal pain, present for several days, with some vomiting and diarrhea. His wife is ill with a similar problem. He continues to drink alcohol during this illness. He denies fever, chills, cough, shortness of breath, chest pain, back pain, weakness or dizziness. He states that he has had similar pain, "many times in the past when I had a kidney stone". No recent kidney stone problems. There are no other known modifying factors.  Past Medical History  Diagnosis Date  . Renal disorder   . Kidney stone    History reviewed. No pertinent past surgical history. History reviewed. No pertinent family history. Social History  Substance Use Topics  . Smoking status: Current Every Day Smoker  . Smokeless tobacco: None  . Alcohol Use: Yes    Review of Systems  All other systems reviewed and are negative.     Allergies  Review of patient's allergies indicates no known allergies.  Home Medications   Prior to Admission medications   Medication Sig Start Date End Date Taking? Authorizing Provider  clonazePAM (KLONOPIN) 0.5 MG tablet Take 0.5 mg by mouth 2 (two) times daily as needed. Anxiety 01/22/16  Yes Historical Provider, MD  OLANZapine (ZYPREXA) 20 MG tablet Take 20 mg by mouth at bedtime.  01/01/16  Yes Historical Provider, MD  OLANZapine (ZYPREXA) 5 MG tablet Take 5 mg by mouth every morning. 05/08/16  Yes Historical Provider, MD  ibuprofen (ADVIL,MOTRIN) 600 MG tablet Take 1 tablet (600 mg total) by mouth every 8 (eight) hours as needed. Patient not taking: Reported on 05/18/2016 12/05/15   Azalia BilisKevin Campos, MD  ondansetron [facility](ZOFRAN) 8 MG tablet Take 1 tablet (8 mg total) by mouth  every 8 (eight) hours as needed for nausea or vomiting. 05/18/16   Mancel BaleElliott Minnetta Sandora, MD  traMADol (ULTRAM) 50 MG tablet Take 1 tablet (50 mg total) by mouth every 6 (six) hours as needed for moderate pain. 05/18/16   Mancel BaleElliott Jarett Dralle, MD   BP 126/103 mmHg  Pulse 51  Temp(Src) 98.2 F (36.8 C) (Oral)  Resp 16  Ht 6\' 1"  (1.854 m)  Wt 175 lb (79.379 kg)  BMI 23.09 kg/m2  SpO2 99% Physical Exam  Constitutional: He is oriented to person, place, and time. He appears well-developed and well-nourished. No distress.  HENT:  Head: Normocephalic and atraumatic.  Right Ear: External ear normal.  Left Ear: External ear normal.  Eyes: Conjunctivae and EOM are normal. Pupils are equal, round, and reactive to light.  Neck: Normal range of motion and phonation normal. Neck supple.  Cardiovascular: Normal rate, regular rhythm and normal heart sounds.   Pulmonary/Chest: Effort normal and breath sounds normal. He exhibits no bony tenderness.  Abdominal: Soft. There is tenderness (Right upper quadrant, mild).  Musculoskeletal: Normal range of motion.  Neurological: He is alert and oriented to person, place, and time. No cranial nerve deficit or sensory deficit. He exhibits normal muscle tone. Coordination normal.  Skin: Skin is warm, dry and intact.  Psychiatric: He has a normal mood and affect. His behavior is normal. Judgment and thought content normal.  Nursing note and vitals reviewed.   ED Course  Procedures (including critical care time)  Medications  HYDROcodone-acetaminophen (NORCO/VICODIN)  5-325 MG per tablet 1 tablet (1 tablet Oral Not Given 05/18/16 2259)  traMADol (ULTRAM) tablet 50 mg (not administered)  ondansetron (ZOFRAN-ODT) disintegrating tablet 8 mg (8 mg Oral Given 05/18/16 2253)    Patient Vitals for the past 24 hrs:  BP Temp Temp src Pulse Resp SpO2 Height Weight  05/18/16 2130 (!) 126/103 mmHg - - (!) 51 - 99 % - -  05/18/16 1724 126/75 mmHg 98.2 F (36.8 C) Oral (!) 53 16 98 % 6'  1" (1.854 m) 175 lb (79.379 kg)    10:22 PM Reevaluation with update and discussion. After initial assessment and treatment, an updated evaluation reveals He remains comfortable, is drinking fluids easily. There are no other complaints. Findings discussed with patient and wife, all questions answered. Jack Bolio L     Labs Review Labs Reviewed  LIPASE, BLOOD - Abnormal; Notable for the following:    Lipase 62 (*)    All other components within normal limits  COMPREHENSIVE METABOLIC PANEL - Abnormal; Notable for the following:    Glucose, Bld 101 (*)    All other components within normal limits  CBC  URINALYSIS, ROUTINE W REFLEX MICROSCOPIC (NOT AT [facility])    Imaging Review Ct Renal Stone Study  05/18/2016  CLINICAL DATA:  RUQ ABD PAIN 4 episodes of diarrhea today. PMH: Renal disorder; Kidney stone EXAM: CT ABDOMEN AND PELVIS WITHOUT CONTRAST TECHNIQUE: Multidetector CT imaging of the abdomen and pelvis was performed following the standard protocol without IV contrast. COMPARISON:  12/05/2015 FINDINGS: Lower chest:  No acute findings. Hepatobiliary: No mass visualized on this un-enhanced exam. Gallbladder is nondistended. Pancreas: No mass or inflammatory process identified on this un-enhanced exam. Spleen: Within normal limits in size. Adrenals/Urinary Tract: No evidence of urolithiasis or hydronephrosis. No definite mass visualized on this un-enhanced exam. Urinary bladder physiologically distended. Stomach/Bowel: No evidence of obstruction, inflammatory process, or abnormal fluid collections. Normal appendix. Vascular/Lymphatic: No pathologically enlarged lymph nodes. No evidence of abdominal aortic aneurysm. Reproductive: No mass or other significant abnormality. Other: No ascites.  No free air. Musculoskeletal: No suspicious bone lesions identified. Small Schmorl's nodes in the lower thoracic and lumbar spine. IMPRESSION: 1. Negative.  No nephrolithiasis or hydronephrosis. Electronically  Signed   By: Corlis Leak M.D.   On: 05/18/2016 22:13   I have personally reviewed and evaluated these images and lab results as part of my medical decision-making.   EKG Interpretation None      MDM   Final diagnoses:  Right upper quadrant pain  Nausea vomiting and diarrhea    Nonspecific abdominal pain with nausea, vomiting and diarrhea. Patient's wife has a similar problem. It is highly likely that this is a viral process. His minimal elevation in lipase, indicating pancreatic inflammation. Patient continues to drink alcohol despite being ill in the last several days. Doubt serious bacterial infection , metabolic instability or impending vascular collapse.  Nursing Notes Reviewed/ Care Coordinated Applicable Imaging Reviewed Interpretation of Laboratory Data incorporated into ED treatment  The patient appears reasonably screened and/or stabilized for discharge and I doubt any other medical condition or other [facility] requiring further screening, evaluation, or treatment in the ED at this time prior to discharge.  Plan: Home Medications- Norco, Zofran; Home Treatments- rest, gradually advance diet; return here if the recommended treatment, does not improve the symptoms; Recommended follow up- these become apparent     Mancel Bale, MD 05/18/16 1610  Mancel Bale, MD 05/18/16 2302

## 2016-05-18 NOTE — Discharge Instructions (Signed)
Abdominal Pain, Adult Many things can cause abdominal pain. Usually, abdominal pain is not caused by a disease and will improve without treatment. It can often be observed and treated at home. Your health care provider will do a physical exam and possibly order blood tests and X-rays to help determine the seriousness of your pain. However, in many cases, more time must pass before a clear cause of the pain can be found. Before that point, your health care provider may not know if you need more testing or further treatment. HOME CARE INSTRUCTIONS Monitor your abdominal pain for any changes. The following actions may help to alleviate any discomfort you are experiencing:  Only take over-the-counter or prescription medicines as directed by your health care provider.  Do not take laxatives unless directed to do so by your health care provider.  Try a clear liquid diet (broth, tea, or water) as directed by your health care provider. Slowly move to a bland diet as tolerated. SEEK MEDICAL CARE IF:  You have unexplained abdominal pain.  You have abdominal pain associated with nausea or diarrhea.  You have pain when you urinate or have a bowel movement.  You experience abdominal pain that wakes you in the night.  You have abdominal pain that is worsened or improved by eating food.  You have abdominal pain that is worsened with eating fatty foods.  You have a fever. SEEK IMMEDIATE MEDICAL CARE IF:  Your pain does not go away within 2 hours.  You keep throwing up (vomiting).  Your pain is felt only in portions of the abdomen, such as the right side or the left lower portion of the abdomen.  You pass bloody or black tarry stools. MAKE SURE YOU:  Understand these instructions.  Will watch your condition.  Will get help right away if you are not doing well or get worse.   This information is not intended to replace advice given to you by your health care provider. Make sure you discuss  any questions you have with your health care provider.   Document Released: 08/26/2005 Document Revised: 08/07/2015 Document Reviewed: 07/26/2013 Elsevier Interactive Patient Education 2016 [place].  Diarrhea Diarrhea is frequent loose and watery bowel movements. It can cause you to feel weak and dehydrated. Dehydration can cause you to become tired and thirsty, have a dry mouth, and have decreased urination that often is dark yellow. Diarrhea is a sign of another problem, most often an infection that will not last long. In most cases, diarrhea typically lasts 2-3 days. However, it can last longer if it is a sign of something more serious. It is important to treat your diarrhea as directed by your caregiver to lessen or prevent future episodes of diarrhea. CAUSES  Some common causes include:  Gastrointestinal infections caused by viruses, bacteria, or parasites.  Food poisoning or food allergies.  Certain medicines, such as antibiotics, chemotherapy, and laxatives.  Artificial sweeteners and fructose.  Digestive disorders. HOME CARE INSTRUCTIONS  Ensure adequate fluid intake (hydration): Have 1 cup (8 oz) of fluid for each diarrhea episode. Avoid fluids that contain simple sugars or sports drinks, fruit juices, whole milk products, and sodas. Your urine should be clear or pale yellow if you are drinking enough fluids. Hydrate with an oral rehydration solution that you can purchase at pharmacies, retail stores, and online. You can prepare an oral rehydration solution at home by mixing the following ingredients together:   - tsp table salt.   tsp baking soda.  tsp salt substitute containing potassium chloride.  1  tablespoons sugar.  1 L (34 oz) of water.  Certain foods and beverages may increase the speed at which food moves through the gastrointestinal (GI) tract. These foods and beverages should be avoided and include:  Caffeinated and alcoholic beverages.  High-fiber  foods, such as raw fruits and vegetables, nuts, seeds, and whole grain breads and cereals.  Foods and beverages sweetened with sugar alcohols, such as xylitol, sorbitol, and mannitol.  Some foods may be well tolerated and may help thicken stool including:  Starchy foods, such as rice, toast, pasta, low-sugar cereal, oatmeal, grits, baked potatoes, crackers, and bagels.  Bananas.  Applesauce.  Add probiotic-rich foods to help increase healthy bacteria in the GI tract, such as yogurt and fermented milk products.  Wash your hands well after each diarrhea episode.  Only take over-the-counter or prescription medicines as directed by your caregiver.  Take a warm bath to relieve any burning or pain from frequent diarrhea episodes. SEEK IMMEDIATE MEDICAL CARE IF:   You are unable to keep fluids down.  You have persistent vomiting.  You have blood in your stool, or your stools are black and tarry.  You do not urinate in 6-8 hours, or there is only a small amount of very dark urine.  You have abdominal pain that increases or localizes.  You have weakness, dizziness, confusion, or light-headedness.  You have a severe headache.  Your diarrhea gets worse or does not get better.  You have a fever or persistent symptoms for more than 2-3 days.  You have a fever and your symptoms suddenly get worse. MAKE SURE YOU:   Understand these instructions.  Will watch your condition.  Will get help right away if you are not doing well or get worse.   This information is not intended to replace advice given to you by your health care provider. Make sure you discuss any questions you have with your health care provider.   Document Released: 11/06/2002 Document Revised: 12/07/2014 Document Reviewed: 07/24/2012 Elsevier Interactive Patient Education 2016 [employer]  Food Choices to Help Relieve Diarrhea, Adult When you have diarrhea, the foods you eat and your eating habits are very  important. Choosing the right foods and drinks can help relieve diarrhea. Also, because diarrhea can last up to 7 days, you need to replace lost fluids and electrolytes (such as sodium, potassium, and chloride) in order to help prevent dehydration.  WHAT GENERAL GUIDELINES DO I NEED TO FOLLOW?  Slowly drink 1 cup (8 oz) of fluid for each episode of diarrhea. If you are getting enough fluid, your urine will be clear or pale yellow.  Eat starchy foods. Some good choices include white rice, white toast, pasta, low-fiber cereal, baked potatoes (without the skin), saltine crackers, and bagels.  Avoid large servings of any cooked vegetables.  Limit fruit to two servings per day. A serving is  cup or 1 small piece.  Choose foods with less than 2 g of fiber per serving.  Limit fats to less than 8 tsp (38 g) per day.  Avoid fried foods.  Eat foods that have probiotics in them. Probiotics can be found in certain dairy products.  Avoid foods and beverages that may increase the speed at which food moves through the stomach and intestines (gastrointestinal tract). Things to avoid include:  High-fiber foods, such as dried fruit, raw fruits and vegetables, nuts, seeds, and whole grain foods.  Spicy foods and high-fat foods.  Foods and beverages sweetened with high-fructose corn syrup, honey, or sugar alcohols such as xylitol, sorbitol, and mannitol. WHAT FOODS ARE RECOMMENDED? Grains White rice. White, [place], or pita breads (fresh or toasted), including plain rolls, buns, or bagels. White pasta. Saltine, soda, or graham crackers. Pretzels. Low-fiber cereal. Cooked cereals made with water (such as cornmeal, farina, or cream cereals). Plain muffins. Matzo. Melba toast. Zwieback.  Vegetables Potatoes (without the skin). Strained tomato and vegetable juices. Most well-cooked and canned vegetables without seeds. Tender lettuce. Fruits Cooked or canned applesauce, apricots, cherries, fruit cocktail,  grapefruit, peaches, pears, or plums. Fresh bananas, apples without skin, cherries, grapes, cantaloupe, grapefruit, peaches, oranges, or plums.  Meat and Other Protein Products Baked or boiled chicken. Eggs. Tofu. Fish. Seafood. Smooth peanut butter. Ground or well-cooked tender beef, ham, veal, lamb, pork, or poultry.  Dairy Plain yogurt, kefir, and unsweetened liquid yogurt. Lactose-free milk, buttermilk, or soy milk. Plain hard cheese. Beverages Sport drinks. Clear broths. Diluted fruit juices (except prune). Regular, caffeine-free sodas such as ginger ale. Water. Decaffeinated teas. Oral rehydration solutions. Sugar-free beverages not sweetened with sugar alcohols. Other Bouillon, broth, or soups made from recommended foods.  The items listed above may not be a complete list of recommended foods or beverages. Contact your dietitian for more options. WHAT FOODS ARE NOT RECOMMENDED? Grains Whole grain, whole wheat, bran, or rye breads, rolls, pastas, crackers, and cereals. Wild or brown rice. Cereals that contain more than 2 g of fiber per serving. Corn tortillas or taco shells. Cooked or dry oatmeal. Granola. Popcorn. Vegetables Raw vegetables. Cabbage, broccoli, Brussels sprouts, artichokes, baked beans, beet greens, corn, kale, legumes, peas, sweet potatoes, and yams. Potato skins. Cooked spinach and cabbage. Fruits Dried fruit, including raisins and dates. Raw fruits. Stewed or dried prunes. Fresh apples with skin, apricots, mangoes, pears, raspberries, and strawberries.  Meat and Other Protein Products Chunky peanut butter. Nuts and seeds. Beans and lentils. Berniece Salines.  Dairy High-fat cheeses. Milk, chocolate milk, and beverages made with milk, such as milk shakes. Cream. Ice cream. Sweets and Desserts Sweet rolls, doughnuts, and sweet breads. Pancakes and waffles. Fats and Oils Butter. Cream sauces. Margarine. Salad oils. Plain salad dressings. Olives. Avocados.  Beverages Caffeinated  beverages (such as coffee, tea, soda, or energy drinks). Alcoholic beverages. Fruit juices with pulp. Prune juice. Soft drinks sweetened with high-fructose corn syrup or sugar alcohols. Other Coconut. Hot sauce. Chili powder. Mayonnaise. Gravy. Cream-based or milk-based soups.  The items listed above may not be a complete list of foods and beverages to avoid. Contact your dietitian for more information. WHAT SHOULD I DO IF I BECOME DEHYDRATED? Diarrhea can sometimes lead to dehydration. Signs of dehydration include dark urine and dry mouth and skin. If you think you are dehydrated, you should rehydrate with an oral rehydration solution. These solutions can be purchased at pharmacies, retail stores, or online.  Drink -1 cup (120-240 mL) of oral rehydration solution each time you have an episode of diarrhea. If drinking this amount makes your diarrhea worse, try drinking smaller amounts more often. For example, drink 1-3 tsp (5-15 mL) every 5-10 minutes.  A general rule for staying hydrated is to drink 1-2 L of fluid per day. Talk to your health care provider about the specific amount you should be drinking each day. Drink enough fluids to keep your urine clear or pale yellow.   This information is not intended to replace advice given to you by your health care provider. Make sure you discuss  any questions you have with your health care provider.   Document Released: 02/06/2004 Document Revised: 12/07/2014 Document Reviewed: 10/09/2013 Elsevier Interactive Patient Education 2016 Elsevier Inc.  Nausea and Vomiting Nausea is a sick feeling that often comes before throwing up (vomiting). Vomiting is a reflex where stomach contents come out of your mouth. Vomiting can cause severe loss of body fluids (dehydration). Children and elderly adults can become dehydrated quickly, especially if they also have diarrhea. Nausea and vomiting are symptoms of a condition or disease. It is important to find the cause  of your symptoms. CAUSES   Direct irritation of the stomach lining. This irritation can result from increased acid production (gastroesophageal reflux disease), infection, food poisoning, taking certain medicines (such as nonsteroidal anti-inflammatory drugs), alcohol use, or tobacco use.  Signals from the brain.These signals could be caused by a headache, heat exposure, an inner ear disturbance, increased pressure in the brain from injury, infection, a tumor, or a concussion, pain, emotional stimulus, or metabolic problems.  An obstruction in the gastrointestinal tract (bowel obstruction).  Illnesses such as diabetes, hepatitis, gallbladder problems, appendicitis, kidney problems, cancer, sepsis, atypical symptoms of a heart attack, or eating disorders.  Medical treatments such as chemotherapy and radiation.  Receiving medicine that makes you sleep (general anesthetic) during surgery. DIAGNOSIS Your caregiver may ask for tests to be done if the problems do not improve after a few days. Tests may also be done if symptoms are severe or if the reason for the nausea and vomiting is not clear. Tests may include:  Urine tests.  Blood tests.  Stool tests.  Cultures (to look for evidence of infection).  X-rays or other imaging studies. Test results can help your caregiver make decisions about treatment or the need for additional tests. TREATMENT You need to stay well hydrated. Drink frequently but in small amounts.You may wish to drink water, sports drinks, clear broth, or eat frozen ice pops or gelatin dessert to help stay hydrated.When you eat, eating slowly may help prevent nausea.There are also some antinausea medicines that may help prevent nausea. HOME CARE INSTRUCTIONS   Take all medicine as directed by your caregiver.  If you do not have an appetite, do not force yourself to eat. However, you must continue to drink fluids.  If you have an appetite, eat a normal diet unless  your caregiver tells you differently.  Eat a variety of complex carbohydrates (rice, wheat, potatoes, bread), lean meats, yogurt, fruits, and vegetables.  Avoid high-fat foods because they are more difficult to digest.  Drink enough water and fluids to keep your urine clear or pale yellow.  If you are dehydrated, ask your caregiver for specific rehydration instructions. Signs of dehydration may include:  Severe thirst.  Dry lips and mouth.  Dizziness.  Dark urine.  Decreasing urine frequency and amount.  Confusion.  Rapid breathing or pulse. SEEK IMMEDIATE MEDICAL CARE IF:   You have blood or brown flecks (like coffee grounds) in your vomit.  You have black or bloody stools.  You have a severe headache or stiff neck.  You are confused.  You have severe abdominal pain.  You have chest pain or trouble breathing.  You do not urinate at least once every 8 hours.  You develop cold or clammy skin.  You continue to vomit for longer than 24 to 48 hours.  You have a fever. MAKE SURE YOU:   Understand these instructions.  Will watch your condition.  Will get help right away  if you are not doing well or get worse.   This information is not intended to replace advice given to you by your health care provider. Make sure you discuss any questions you have with your health care provider.   Document Released: 11/16/2005 Document Revised: 02/08/2012 Document Reviewed: 04/15/2011 Elsevier Interactive Patient Education [employer] Elsevier Inc.

## 2016-07-14 ENCOUNTER — Encounter (HOSPITAL_COMMUNITY): Payer: Self-pay | Admitting: Emergency Medicine

## 2016-07-14 ENCOUNTER — Emergency Department (HOSPITAL_COMMUNITY): Payer: No Typology Code available for payment source

## 2016-07-14 ENCOUNTER — Emergency Department (HOSPITAL_COMMUNITY)
Admission: EM | Admit: 2016-07-14 | Discharge: 2016-07-14 | Disposition: A | Discharge status: Home / Self Care | Payer: No Typology Code available for payment source | Attending: Emergency Medicine | Admitting: Emergency Medicine

## 2016-07-14 DIAGNOSIS — M549 Dorsalgia, unspecified: Secondary | ICD-10-CM | POA: Diagnosis not present

## 2016-07-14 DIAGNOSIS — Y939 Activity, unspecified: Secondary | ICD-10-CM | POA: Diagnosis not present

## 2016-07-14 DIAGNOSIS — F17200 Nicotine dependence, unspecified, uncomplicated: Secondary | ICD-10-CM | POA: Insufficient documentation

## 2016-07-14 DIAGNOSIS — M542 Cervicalgia: Secondary | ICD-10-CM | POA: Diagnosis present

## 2016-07-14 DIAGNOSIS — Y999 Unspecified external cause status: Secondary | ICD-10-CM | POA: Diagnosis not present

## 2016-07-14 DIAGNOSIS — Y92410 Unspecified street and highway as the place of occurrence of the external cause: Secondary | ICD-10-CM | POA: Diagnosis not present

## 2016-07-14 MED ORDER — IBUPROFEN 200 MG PO TABS
600.0000 mg | ORAL_TABLET | Freq: Once | ORAL | Status: AC
  Administered 2016-07-14: 600 mg via ORAL
  Filled 2016-07-14: qty 3

## 2016-07-14 MED ORDER — OXYCODONE-ACETAMINOPHEN 5-325 MG PO TABS
1.0000 | ORAL_TABLET | Freq: Once | ORAL | Status: AC
  Administered 2016-07-14: 1 via ORAL
  Filled 2016-07-14: qty 1

## 2016-07-14 MED ORDER — METHOCARBAMOL 500 MG PO TABS
500.0000 mg | ORAL_TABLET | Freq: Once | ORAL | Status: AC
  Administered 2016-07-14: 500 mg via ORAL
  Filled 2016-07-14: qty 1

## 2016-07-14 NOTE — ED Notes (Signed)
Daniel Tuttle; Transporter, transporting pt to CT.  
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
## 2016-07-14 NOTE — ED Notes (Signed)
Patient transported to CT 

## 2016-12-31 ENCOUNTER — Emergency Department (HOSPITAL_COMMUNITY): Payer: Medicare Other

## 2016-12-31 ENCOUNTER — Encounter (HOSPITAL_COMMUNITY): Payer: Self-pay | Admitting: Emergency Medicine

## 2016-12-31 ENCOUNTER — Emergency Department (HOSPITAL_COMMUNITY)
Admission: EM | Admit: 2016-12-31 | Discharge: 2016-12-31 | Disposition: A | Discharge status: Home / Self Care | Payer: Medicare Other | Attending: Emergency Medicine | Admitting: Emergency Medicine

## 2016-12-31 DIAGNOSIS — Y939 Activity, unspecified: Secondary | ICD-10-CM | POA: Insufficient documentation

## 2016-12-31 DIAGNOSIS — Y92410 Unspecified street and highway as the place of occurrence of the external cause: Secondary | ICD-10-CM | POA: Insufficient documentation

## 2016-12-31 DIAGNOSIS — Y999 Unspecified external cause status: Secondary | ICD-10-CM | POA: Diagnosis not present

## 2016-12-31 DIAGNOSIS — M5442 Lumbago with sciatica, left side: Secondary | ICD-10-CM | POA: Insufficient documentation

## 2016-12-31 DIAGNOSIS — F17200 Nicotine dependence, unspecified, uncomplicated: Secondary | ICD-10-CM | POA: Insufficient documentation

## 2016-12-31 DIAGNOSIS — S0990XA Unspecified injury of head, initial encounter: Secondary | ICD-10-CM | POA: Insufficient documentation

## 2016-12-31 LAB — CBC
HEMATOCRIT: 40.4 % (ref 39.0–52.0)
Hemoglobin: 13.3 g/dL (ref 13.0–17.0)
MCH: 30.9 pg (ref 26.0–34.0)
MCHC: 32.9 g/dL (ref 30.0–36.0)
MCV: 94 fL (ref 78.0–100.0)
Platelets: 220 10*3/uL (ref 150–400)
RBC: 4.3 MIL/uL (ref 4.22–5.81)
RDW: 13.4 % (ref 11.5–15.5)
WBC: 9.8 10*3/uL (ref 4.0–10.5)

## 2016-12-31 LAB — BASIC METABOLIC PANEL
Anion gap: 9 (ref 5–15)
BUN: 15 mg/dL (ref 6–20)
CHLORIDE: 104 mmol/L (ref 101–111)
CO2: 27 mmol/L (ref 22–32)
CREATININE: 0.93 mg/dL (ref 0.61–1.24)
Calcium: 10.2 mg/dL (ref 8.9–10.3)
GFR calc Af Amer: >60 mL/min (ref >60)
Glucose, Bld: 106 mg/dL — ABNORMAL HIGH (ref 65–99)
POTASSIUM: 4.3 mmol/L (ref 3.5–5.1)
SODIUM: 140 mmol/L (ref 135–145)

## 2016-12-31 MED ORDER — IBUPROFEN 600 MG PO TABS: 600.0000 mg | ORAL_TABLET | Freq: Four times a day (QID) | ORAL | 0 refills | Status: AC | PRN

## 2016-12-31 MED ORDER — ONDANSETRON 8 MG PO TBDP
8.0000 mg | ORAL_TABLET | Freq: Once | ORAL | Status: AC
  Administered 2016-12-31: 8 mg via ORAL
  Filled 2016-12-31: qty 1

## 2016-12-31 MED ORDER — METHOCARBAMOL 500 MG PO TABS: 500.0000 mg | ORAL_TABLET | Freq: Every evening | ORAL | 0 refills | Status: AC | PRN

## 2016-12-31 MED ORDER — TRAMADOL HCL 50 MG PO TABS: 50.0000 mg | ORAL_TABLET | Freq: Four times a day (QID) | ORAL | 0 refills | Status: AC | PRN

## 2016-12-31 MED ORDER — DEXAMETHASONE SODIUM PHOSPHATE 10 MG/ML IJ SOLN
10.0000 mg | Freq: Once | INTRAMUSCULAR | Status: AC
  Administered 2016-12-31: 10 mg via INTRAMUSCULAR
  Filled 2016-12-31: qty 1

## 2016-12-31 MED ORDER — IBUPROFEN 200 MG PO TABS
600.0000 mg | ORAL_TABLET | Freq: Once | ORAL | Status: AC
  Administered 2016-12-31: 600 mg via ORAL
  Filled 2016-12-31: qty 3

## 2016-12-31 MED ORDER — METHOCARBAMOL 500 MG PO TABS
1000.0000 mg | ORAL_TABLET | Freq: Once | ORAL | Status: AC
  Administered 2016-12-31: 1000 mg via ORAL
  Filled 2016-12-31: qty 2
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
## 2016-12-31 NOTE — Discharge Instructions (Signed)
Take Ibuprofen for the next week. Take this medicine with food. Take muscle relaxer at bedtime to help you sleep. This medicine makes you drowsy so do not take before driving or work Use a heating pad for sore muscles - use for 20 minutes several times a day
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
## 2017-08-14 ENCOUNTER — Emergency Department (HOSPITAL_COMMUNITY)
Admission: EM | Admit: 2017-08-14 | Discharge: 2017-08-15 | Disposition: A | Discharge status: Home / Self Care | Payer: Medicare Other | Attending: Emergency Medicine | Admitting: Emergency Medicine

## 2017-08-14 ENCOUNTER — Encounter (HOSPITAL_COMMUNITY): Payer: Self-pay | Admitting: Emergency Medicine

## 2017-08-14 DIAGNOSIS — S61214A Laceration without foreign body of right ring finger without damage to nail, initial encounter: Secondary | ICD-10-CM | POA: Diagnosis not present

## 2017-08-14 DIAGNOSIS — S61212A Laceration without foreign body of right middle finger without damage to nail, initial encounter: Secondary | ICD-10-CM | POA: Diagnosis not present

## 2017-08-14 DIAGNOSIS — S61011A Laceration without foreign body of right thumb without damage to nail, initial encounter: Secondary | ICD-10-CM | POA: Diagnosis not present

## 2017-08-14 DIAGNOSIS — Y92008 Other place in unspecified non-institutional (private) residence as the place of occurrence of the external cause: Secondary | ICD-10-CM | POA: Diagnosis not present

## 2017-08-14 DIAGNOSIS — Y999 Unspecified external cause status: Secondary | ICD-10-CM | POA: Diagnosis not present

## 2017-08-14 DIAGNOSIS — W25XXXA Contact with sharp glass, initial encounter: Secondary | ICD-10-CM | POA: Insufficient documentation

## 2017-08-14 DIAGNOSIS — S61210A Laceration without foreign body of right index finger without damage to nail, initial encounter: Secondary | ICD-10-CM | POA: Insufficient documentation

## 2017-08-14 DIAGNOSIS — F17200 Nicotine dependence, unspecified, uncomplicated: Secondary | ICD-10-CM | POA: Insufficient documentation

## 2017-08-14 DIAGNOSIS — S60921A Unspecified superficial injury of right hand, initial encounter: Secondary | ICD-10-CM | POA: Diagnosis present

## 2017-08-14 DIAGNOSIS — Y939 Activity, unspecified: Secondary | ICD-10-CM | POA: Diagnosis not present

## 2017-08-14 DIAGNOSIS — Z79899 Other long term (current) drug therapy: Secondary | ICD-10-CM | POA: Diagnosis not present
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
## 2017-08-15 ENCOUNTER — Emergency Department (HOSPITAL_COMMUNITY): Payer: Medicare Other

## 2017-08-15 MED ORDER — OXYCODONE-ACETAMINOPHEN 5-325 MG PO TABS: 1.0000 | ORAL_TABLET | Freq: Four times a day (QID) | ORAL | 0 refills | Status: AC | PRN

## 2017-08-15 MED ORDER — LIDOCAINE HCL (PF) 1 % IJ SOLN
30.0000 mL | Freq: Once | INTRAMUSCULAR | Status: AC
  Administered 2017-08-15: 30 mL
  Filled 2017-08-15: qty 30

## 2017-08-15 MED ORDER — CEPHALEXIN 500 MG PO CAPS: 500.0000 mg | ORAL_CAPSULE | Freq: Four times a day (QID) | ORAL | 0 refills | Status: AC

## 2017-08-15 NOTE — ED Notes (Signed)
ED Provider at bedside. 

## 2017-08-15 NOTE — Discharge Instructions (Signed)
Please take Ibuprofen (Advil, motrin) and Tylenol (acetaminophen) to relieve your pain.  You may take up to 600 MG (3 pills) of normal strength ibuprofen every 8 hours as needed.  In between doses of ibuprofen you make take tylenol, up to 1,000 mg (two extra strength pills).  Do not take more than 3,000 mg tylenol in a 24 hour period.  Please check all medication labels as many medications such as pain and cold medications may contain tylenol.  Do not drink alcohol while taking these medications.  Do not take other NSAID'S while taking ibuprofen (such as aleve or naproxen).  Please take ibuprofen with food to decrease stomach upset. ° °Today you received medications that may make you sleepy or impair your ability to make decisions.  For the next 24 hours please do not drive, operate heavy machinery, care for a small child with out another adult present, or perform any activities that may cause harm to you or someone else if you were to fall asleep or be impaired.  ° °You are being prescribed a medication which may make you sleepy. Please follow up of listed precautions for at least 24 hours after taking one dose. ° °You may have diarrhea from the antibiotics.  It is very important that you continue to take the antibiotics even if you get diarrhea unless a medical professional tells you that you may stop taking them.  If you stop too early the bacteria you are being treated for will become stronger and you may need different, more powerful antibiotics that have more side effects and worsening diarrhea.  Please stay well hydrated and consider probiotics as they may decrease the severity of your diarrhea.   °

## 2017-08-15 NOTE — ED Provider Notes (Signed)
[facility]-EMERGENCY DEPT Provider Note   CSN: [id number] Arrival date & time: 08/14/17  2109     History   Chief Complaint Chief Complaint  Patient presents with  . Laceration    HPI Jordan Matthews is a 31 y.o. male who presents for evaluation of right hand pain. He reports that he was knocking on the door to his house when the window shattered and his hand went through a glass. He reports that he has cuts to his right thumb, and first 3 fingers. He reports his last tetanus shot was within in the past 2 years.  He denies any numbness or tingling.    HPI  History reviewed. No pertinent past medical history.  There are no active problems to display for this patient.   Past Surgical History:  Procedure Laterality Date  . CHOLECYSTECTOMY         Home Medications    Prior to Admission medications   Medication Sig Start Date End Date Taking? Authorizing Provider  acetaminophen (TYLENOL) 500 MG tablet Take 500 mg by mouth every 6 (six) hours as needed for mild pain.    [provider]  cephALEXin (KEFLEX) 500 MG capsule Take 1 capsule (500 mg total) by mouth 4 (four) times daily. 08/15/17   Cristina Gong, PA-C  clonazePAM (KLONOPIN) 0.5 MG tablet Take 0.5 mg by mouth 2 (two) times daily as needed. Anxiety 01/22/16   [provider]  ibuprofen (ADVIL,MOTRIN) 600 MG tablet Take 1 tablet (600 mg total) by mouth every 8 (eight) hours as needed. 12/05/15   Azalia Bilis, MD  ibuprofen (ADVIL,MOTRIN) 600 MG tablet Take 1 tablet (600 mg total) by mouth every 6 (six) hours as needed. 12/31/16   Bethel Born, PA-C  methocarbamol (ROBAXIN) 500 MG tablet Take 1 tablet (500 mg total) by mouth at bedtime and may repeat dose one time if needed. 12/31/16   Bethel Born, PA-C  OLANZapine (ZYPREXA) 20 MG tablet Take 20 mg by mouth at bedtime.  01/01/16   [provider]  OLANZapine (ZYPREXA) 5 MG tablet Take 5 mg by mouth every morning. 05/08/16   [provider]  oxyCODONE-acetaminophen (PERCOCET/ROXICET) 5-325 MG tablet Take 1 tablet by mouth every 6 (six) hours as needed for severe pain. 08/15/17   Cristina Gong, PA-C  traMADol (ULTRAM) 50 MG tablet Take 1 tablet (50 mg total) by mouth every 6 (six) hours as needed. 12/31/16   Bethel Born, PA-C    Family History No family history on file.  Social History Social History  Substance Use Topics  . Smoking status: Current Every Day Smoker  . Smokeless tobacco: Never Used  . Alcohol use Yes     Allergies   Hydrocodone   Review of Systems Review of Systems  Skin: Positive for wound. Negative for color change, pallor and rash.  Neurological: Negative for weakness and numbness.  All other systems reviewed and are negative.    Physical Exam Updated Vital Signs BP (!) 140/93 (BP Location: Right Arm)   Pulse 61   Temp 98.3 F (36.8 C) (Oral)   Resp 18   Ht  (1.854 m)   Wt 79.4 kg (175 lb)   SpO2 100%   BMI 23.09 kg/m   Physical Exam  Constitutional: He appears well-developed and well-nourished. No distress.  HENT:  Head: Normocephalic and atraumatic.  Right Ear: External ear normal.  Left Ear: External ear normal.  Mouth/Throat: Oropharynx is clear and moist.  Eyes: Pupils are equal, round, and reactive to light. Conjunctivae are normal. Right eye exhibits no discharge. Left eye exhibits no discharge. No scleral icterus.  Neck: Normal range of motion. Neck supple.  Cardiovascular: Normal rate and regular rhythm.   Pulmonary/Chest: Effort normal. No stridor. No respiratory distress.  Abdominal: He exhibits no distension.  Musculoskeletal: He exhibits no edema or deformity.  Neurological: He is alert. He exhibits normal muscle tone.  Skin: Skin is warm and dry. He is not diaphoretic.  He has multiple lacerations present over right hand including multiple sub cm lacerations that are superficial, not requiring closure.   Please see procedure note for the three  lacerations that needed closure.   Psychiatric: He has a normal mood and affect. His behavior is normal.  Nursing note and vitals reviewed.    ED Treatments / Results  Labs (all labs ordered are listed, but only abnormal results are displayed) Labs Reviewed - No data to display  EKG  EKG Interpretation None       Radiology Dg Hand Complete Right  Result Date: 08/15/2017 CLINICAL DATA:  Punch through glass window EXAM: RIGHT HAND - COMPLETE 3+ VIEW COMPARISON:  None. FINDINGS: Frontal, oblique, and lateral views obtained. There is no appreciable fracture or dislocation. Joint spaces appear normal. No erosive change. No radiopaque foreign body or soft tissue air evident. IMPRESSION: No fracture or dislocation. No evident arthropathy. No radiopaque foreign body or soft tissue air appreciable. Electronically Signed   By: Bretta Bang III M.D.   On: 08/15/2017 00:47    Procedures Right thumb .Marland KitchenLaceration Repair Date/Time: 08/15/2017 7:54 PM Performed by: Cristina Gong Authorized by: Cristina Gong   Consent:    Consent obtained:  Verbal   Consent given by:  Patient   Risks discussed:  Infection, need for additional repair, nerve damage, pain, poor wound healing, tendon damage, retained foreign body and vascular damage (Retained glass)   Alternatives discussed:  Referral and no treatment Anesthesia (see MAR for exact dosages):    Anesthesia method:  Nerve block   Block location:  Right thumb   Block needle gauge:  25 G   Block anesthetic:  Lidocaine 1% w/o epi   Block injection procedure:  Anatomic landmarks identified, introduced needle, incremental injection, anatomic landmarks palpated and negative aspiration for blood   Block outcome:  Anesthesia achieved Laceration details:    Location:  Finger   Finger location:  R thumb (over dorsal aspect of distal tumb)   Length (cm):  1.5 (C shaped laceration) Repair type:    Repair type:  Simple Pre-procedure  details:    Preparation:  Patient was prepped and draped in usual sterile fashion and imaging obtained to evaluate for foreign bodies Exploration:    Hemostasis achieved with:  Direct pressure and tourniquet   Wound exploration: wound explored through full range of motion and entire depth of wound probed and visualized     Wound extent: no foreign bodies/material noted, no muscle damage noted, no nerve damage noted, no tendon damage noted, no underlying fracture noted and no vascular damage noted   Treatment:    Area cleansed with:  Saline (Chlorhexidine)   Amount of cleaning:  Standard   Irrigation solution:  Sterile saline   Irrigation method:  Syringe Skin repair:    Repair method:  Sutures   Suture size:  5-0   Suture material:  Prolene   Suture technique:  Simple interrupted   Number of sutures:  5 Approximation:  Approximation:  Close Post-procedure details:    Dressing:  Antibiotic ointment, bulky dressing, non-adherent dressing and tube gauze   Patient tolerance of procedure:  Tolerated well, no immediate complications   .Marland KitchenLaceration Repair Right middle finger Date/Time: 08/15/2017 7:54 PM Performed by: Cristina Gong Authorized by: Cristina Gong   Consent:    Consent obtained:  Verbal   Consent given by:  Patient   Risks discussed:  Infection, need for additional repair, nerve damage, pain, poor wound healing, tendon damage, retained foreign body and vascular damage (Retained glass)   Alternatives discussed:  Referral and no treatment Anesthesia (see MAR for exact dosages):    Anesthesia method:  Nerve block   Block location:  Right middle finger   Block needle gauge:  25 G   Block anesthetic:  Lidocaine 1% w/o epi   Block injection procedure:  Anatomic landmarks identified, introduced needle, incremental injection, anatomic landmarks palpated and negative aspiration for blood   Block outcome:  Anesthesia achieved Laceration details:    Location:   Finger   Finger location:  Dorsal Right middle finger   Length (cm):  2  Repair type:    Repair type:  Simple Pre-procedure details:    Preparation:  Patient was prepped and draped in usual sterile fashion and imaging obtained to evaluate for foreign bodies Exploration:    Hemostasis achieved with:  Direct pressure and tourniquet   Wound exploration: wound explored through full range of motion and entire depth of wound probed and visualized     Wound extent: no foreign bodies/material noted, no muscle damage noted, no nerve damage noted, no tendon damage noted, no underlying fracture noted and no vascular damage noted   Treatment:    Area cleansed with:  Saline (Chlorhexidine)   Amount of cleaning:  Standard   Irrigation solution:  Sterile saline   Irrigation method:  Syringe Skin repair:    Repair method:  Sutures   Suture size:  5-0   Suture material:  Prolene   Suture technique:  Simple interrupted, horizontal mattress   Number of sutures:  5 Approximation:    Approximation:  Close Post-procedure details:    Dressing:  Antibiotic ointment, bulky dressing, non-adherent dressing and tube gauze   Patient tolerance of procedure:  Tolerated well, no immediate complications  .Marland KitchenLaceration Repair Right ring finger Date/Time: 08/15/2017 7:54 PM Performed by: Cristina Gong Authorized by: Cristina Gong   Consent:    Consent obtained:  Verbal   Consent given by:  Patient   Risks discussed:  Infection, need for additional repair, nerve damage, pain, poor wound healing, tendon damage, retained foreign body and vascular damage (Retained glass)   Alternatives discussed:  Referral and no treatment Anesthesia (see MAR for exact dosages):    Anesthesia method:  Nerve block   Block location:  Right ring finger   Block needle gauge:  25 G   Block anesthetic:  Lidocaine 1% w/o epi   Block injection procedure:  Anatomic landmarks identified, introduced needle, incremental  injection, anatomic landmarks palpated and negative aspiration for blood   Block outcome:  Anesthesia achieved Laceration details:    Location:  Finger   Finger location:  R ring finger   Length (cm):  1.5 Repair type:    Repair type:  Simple Pre-procedure details:    Preparation:  Patient was prepped and draped in usual sterile fashion and imaging obtained to evaluate for foreign bodies Exploration:    Hemostasis achieved with:  Direct pressure  and tourniquet   Wound exploration: wound explored through full range of motion and entire depth of wound probed and visualized     Wound extent: no foreign bodies/material noted, no muscle damage noted, no nerve damage noted, no tendon damage noted, no underlying fracture noted and no vascular damage noted   Treatment:    Area cleansed with:  Saline (Chlorhexidine)   Amount of cleaning:  Standard   Irrigation solution:  Sterile saline   Irrigation method:  Syringe Skin repair:    Repair method:  Sutures   Suture size:  5-0   Suture material:  Prolene   Suture technique:  Simple interrupted   Number of sutures: 4 Approximation:    Approximation:  Close Post-procedure details:    Dressing:  Antibiotic ointment, bulky dressing, non-adherent dressing and tube gauze   Patient tolerance of procedure:  Tolerated well, no immediate complications   Medications Ordered in ED Medications  lidocaine (PF) (XYLOCAINE) 1 % injection 30 mL (30 mLs Infiltration Given 08/15/17 0055)     Initial Impression / Assessment and Plan / ED Course  I have reviewed the triage vital signs and the nursing notes.  Pertinent labs & imaging results that were available during my care of the patient were reviewed by me and considered in my medical decision making (see chart for details).    Pressure irrigation performed. Wound explored and base of wound visualized in a bloodless field without evidence of foreign body.  Laceration occurred < 8 hours prior to repair  which was well tolerated. Tdap up to date. Pt has  no comorbidities to effect normal wound healing. Pt discharged with antibiotics due to wounds being on fingers.  Discussed suture home care with patient and answered questions.  Before and after the procedure the motor function,strength, sensation, and circulatory function was assessed.  After the procedure patient had intact motor function with 5/5 strength (equal to unaffected side) to all joints distal to the incision site with the only sensory changes attributable to the local anesthetic.     Pt to follow-up for wound check and suture removal in 7 days; they are to return to the ED sooner for signs of infection. Pt is hemodynamically stable with no complaints prior to dc.   Consistent with the STOP act, the [facility] was queried for the patient based on the information and address listed in the medical record for the past year, prior to the prescription of home narcotic medications.    Final Clinical Impressions(s) / ED Diagnoses   Final diagnoses:  Laceration of right thumb without foreign body without damage to nail, initial encounter  Laceration of right middle finger without foreign body without damage to nail, initial encounter  Laceration of right index finger without foreign body without damage to nail, initial encounter  Laceration of right ring finger without foreign body without damage to nail, initial encounter    New Prescriptions Discharge Medication List as of 08/15/2017  2:43 AM    START taking these medications   Details  cephALEXin (KEFLEX) 500 MG capsule Take 1 capsule (500 mg total) by mouth 4 (four) times daily., Starting Sun 08/15/2017, Print    oxyCODONE-acetaminophen (PERCOCET/ROXICET) 5-325 MG tablet Take 1 tablet by mouth every 6 (six) hours as needed for severe pain., Starting Sun 08/15/2017, Print         Cristina Gong, PA-C 08/15/17 2025    Dione Booze, MD 08/15/17 2240

## 2018-02-04 IMAGING — CR DG CHEST 2V
2 series · 2 of 2 positions shown · non-contrast
Comparison: 12/16/2015

CLINICAL DATA: Left side chest pain, shortness of breath for 3
hours

EXAM:
CHEST  2 VIEW

[chest pa]
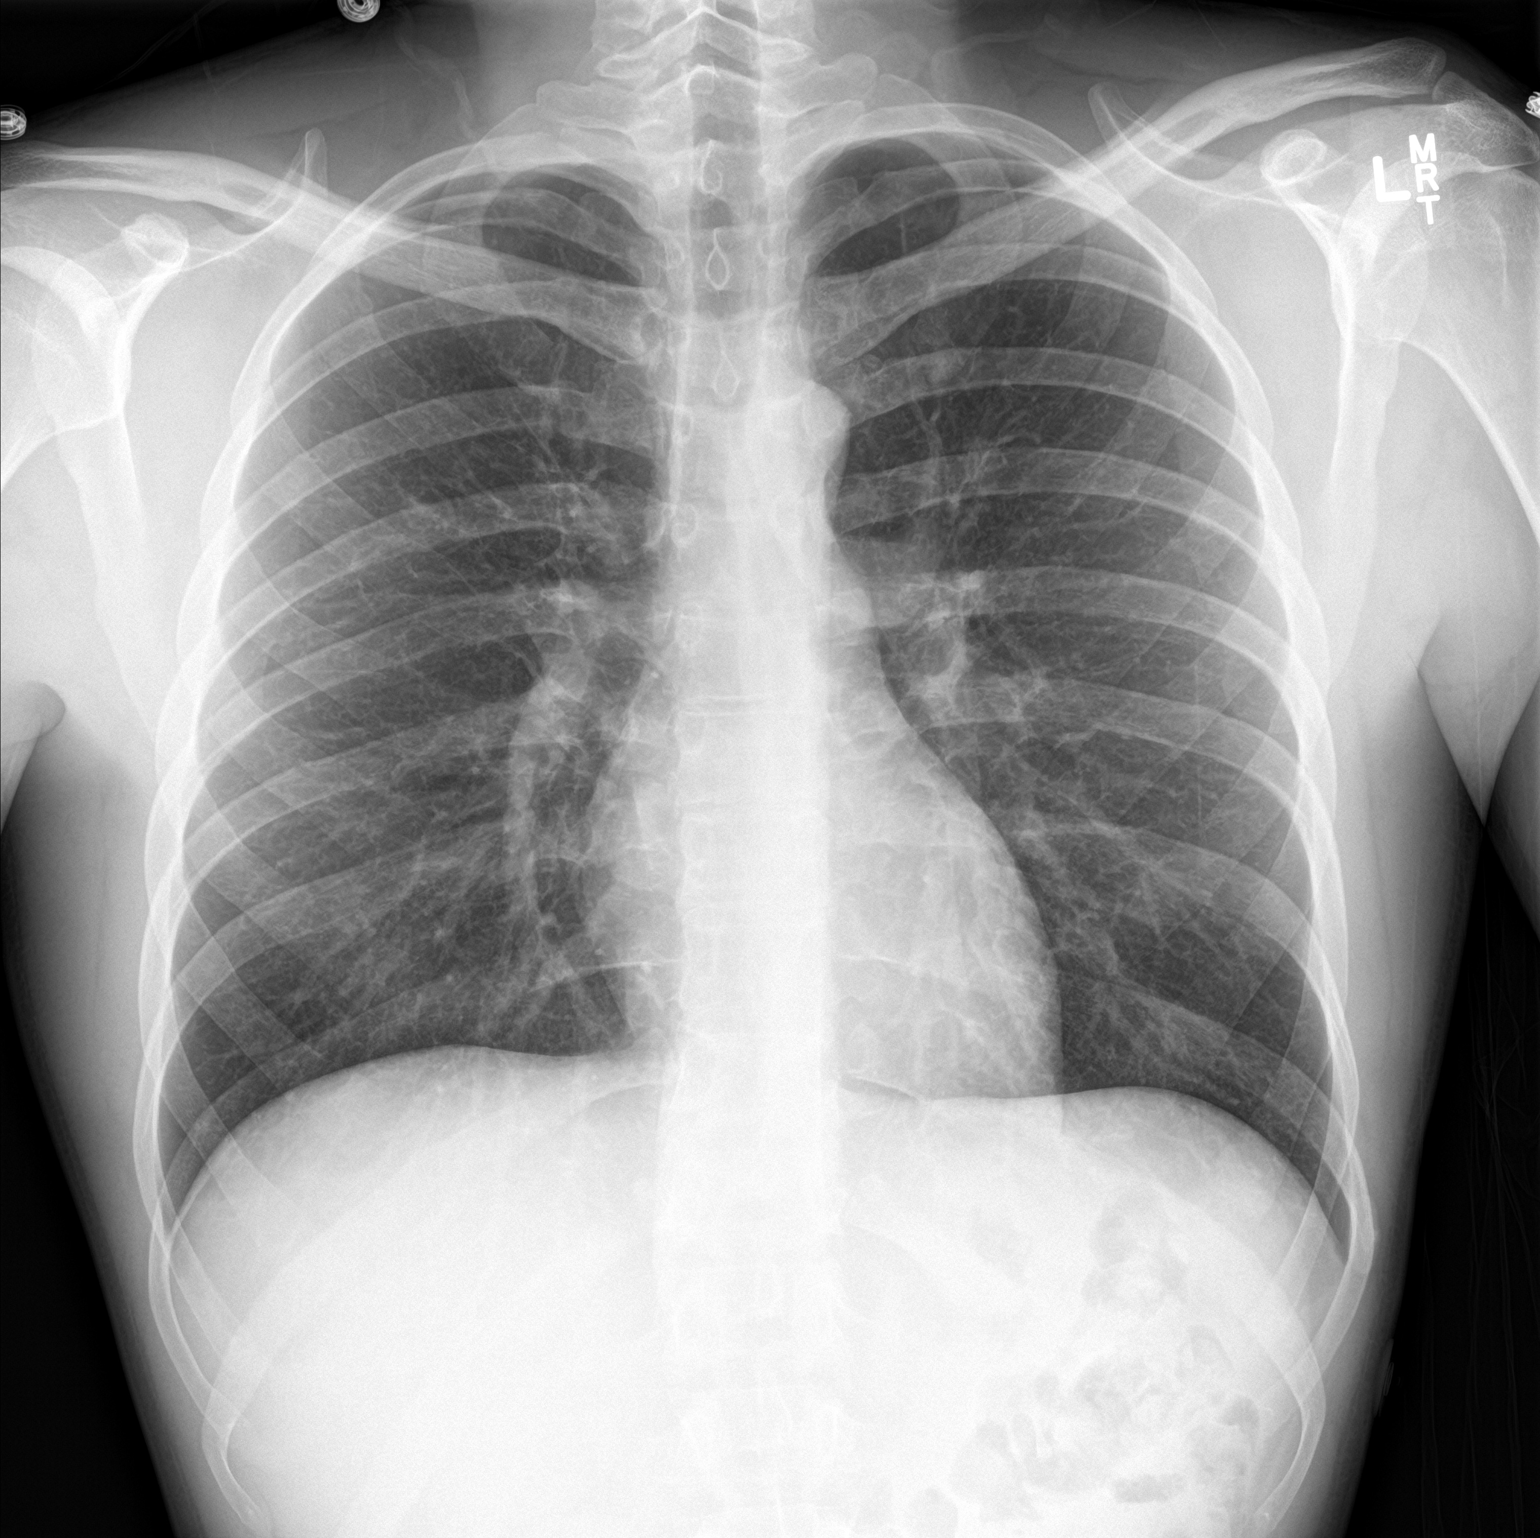

[chest lat]
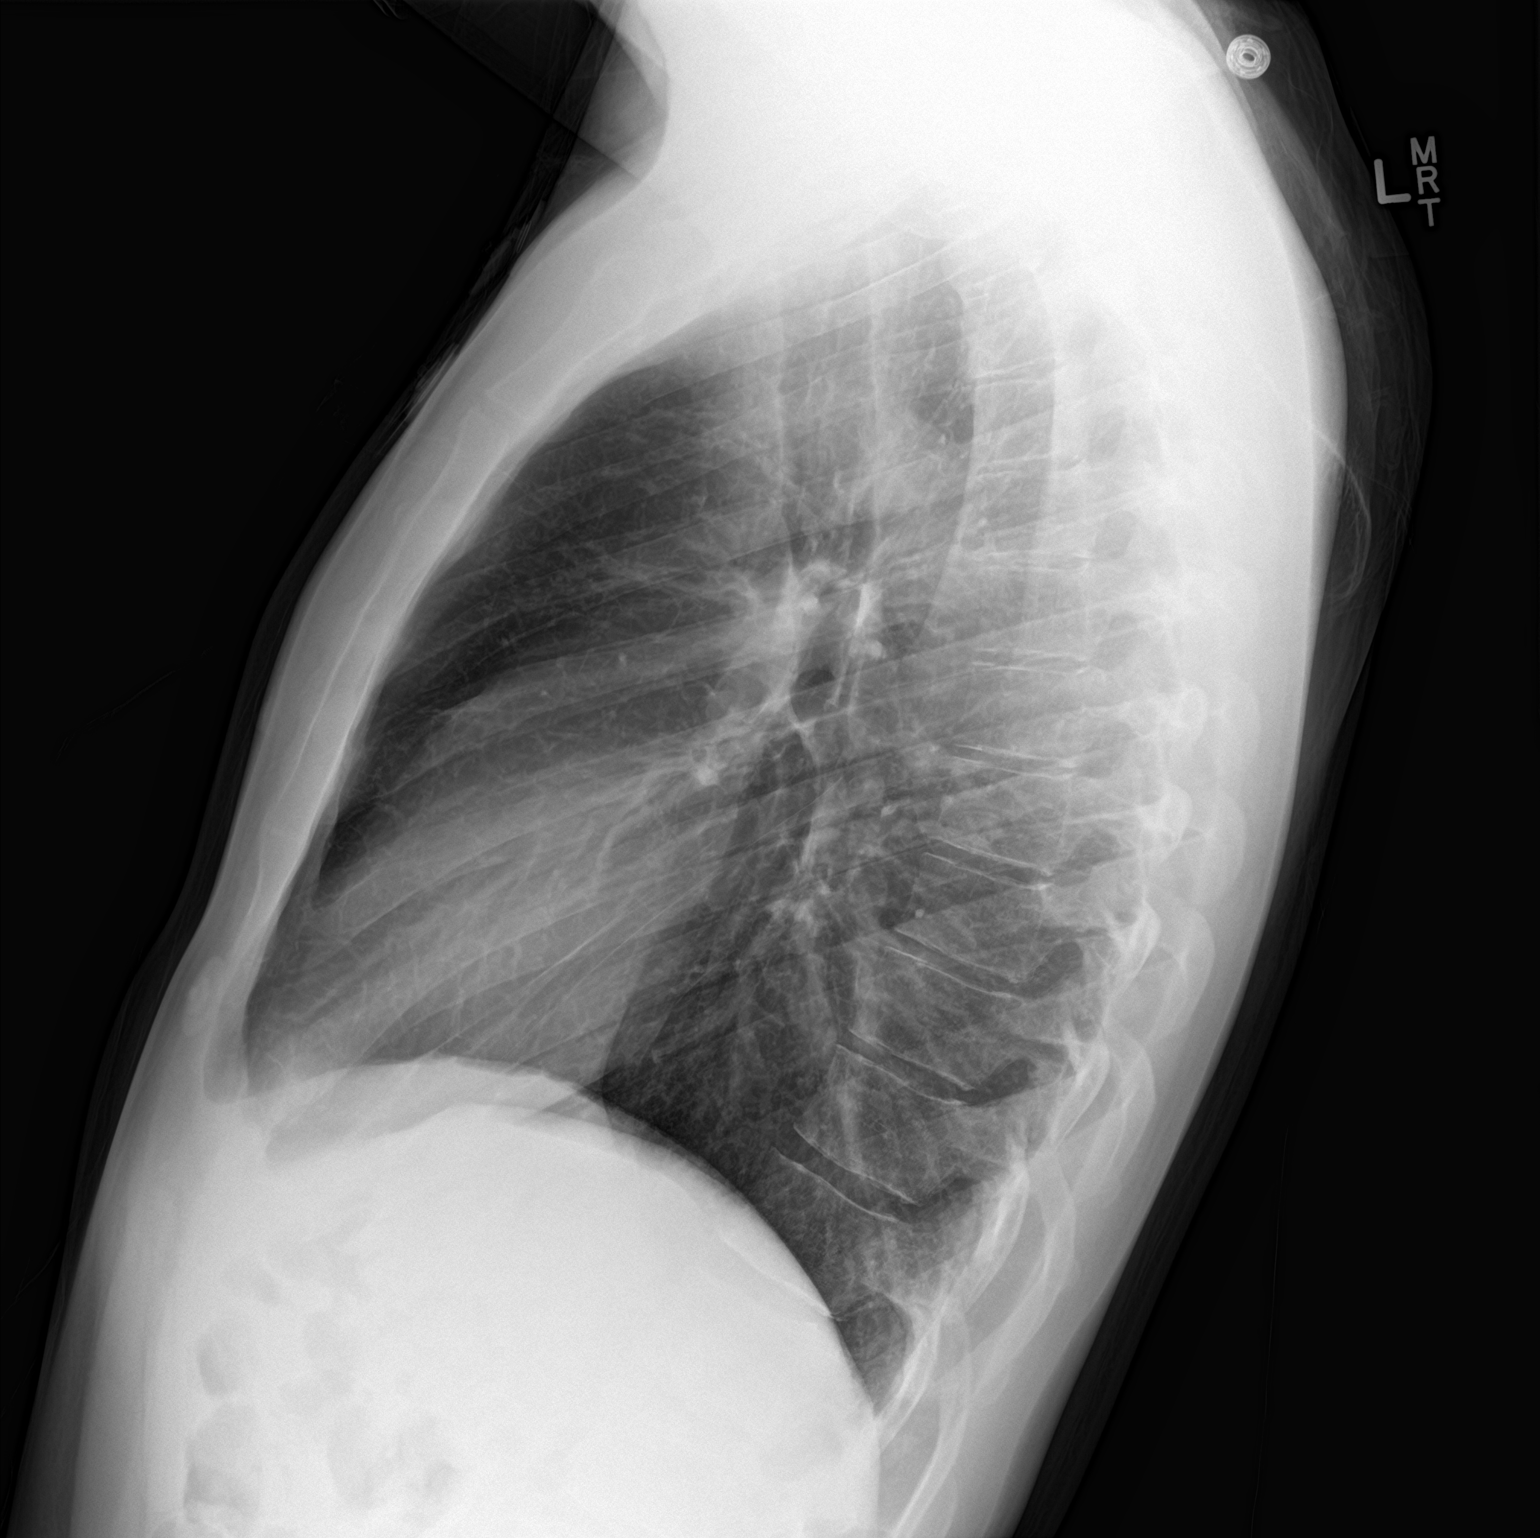

[2 of 2 positions shown; findings below may reference images not displayed]

FINDINGS: The heart size and mediastinal contours are within normal limits.
Both lungs are clear. The visualized skeletal structures are
unremarkable.
IMPRESSION: No active cardiopulmonary disease.

## 2018-08-30 DEATH — deceased
